# Patient Record
Sex: Male | Born: 1960 | Race: Black or African American | Hispanic: No | Marital: Married | Smoking: Current every day smoker
Health system: Southern US, Community
[De-identification: ages and names within clinical notes are randomized; demographics above are authoritative.]

## PROBLEM LIST (undated history)

## (undated) DIAGNOSIS — I10 Essential (primary) hypertension: Secondary | ICD-10-CM

## (undated) DIAGNOSIS — E119 Type 2 diabetes mellitus without complications: Secondary | ICD-10-CM

## (undated) DIAGNOSIS — E7800 Pure hypercholesterolemia, unspecified: Secondary | ICD-10-CM

## (undated) DIAGNOSIS — I639 Cerebral infarction, unspecified: Secondary | ICD-10-CM

## (undated) HISTORY — PX: KNEE SURGERY: SHX244

## (undated) HISTORY — PX: BACK SURGERY: SHX140

## (undated) HISTORY — PX: EXTERNAL EAR SURGERY: SHX627

---

## 1999-01-13 ENCOUNTER — Ambulatory Visit (HOSPITAL_BASED_OUTPATIENT_CLINIC_OR_DEPARTMENT_OTHER): Admission: RE | Admit: 1999-01-13 | Discharge: 1999-01-13 | Payer: Self-pay | Admitting: Specialist

## 1999-11-20 ENCOUNTER — Encounter: Payer: Self-pay | Admitting: Family Medicine

## 1999-11-20 ENCOUNTER — Ambulatory Visit (HOSPITAL_COMMUNITY): Admission: RE | Admit: 1999-11-20 | Discharge: 1999-11-20 | Payer: Self-pay | Admitting: Family Medicine

## 2000-12-25 ENCOUNTER — Emergency Department (HOSPITAL_COMMUNITY): Admission: EM | Admit: 2000-12-25 | Discharge: 2000-12-25 | Payer: Self-pay | Admitting: *Deleted

## 2000-12-25 ENCOUNTER — Encounter: Payer: Self-pay | Admitting: *Deleted

## 2002-09-16 ENCOUNTER — Ambulatory Visit (HOSPITAL_COMMUNITY): Admission: RE | Admit: 2002-09-16 | Discharge: 2002-09-16 | Payer: Self-pay | Admitting: Family Medicine

## 2002-09-16 ENCOUNTER — Encounter: Payer: Self-pay | Admitting: Family Medicine

## 2009-02-14 ENCOUNTER — Encounter: Admission: RE | Admit: 2009-02-14 | Discharge: 2009-02-14 | Payer: Self-pay | Admitting: Family Medicine

## 2013-01-26 ENCOUNTER — Ambulatory Visit: Payer: Self-pay

## 2013-01-26 ENCOUNTER — Other Ambulatory Visit: Payer: Self-pay | Admitting: Occupational Medicine

## 2013-01-26 DIAGNOSIS — Z021 Encounter for pre-employment examination: Secondary | ICD-10-CM

## 2016-09-15 ENCOUNTER — Encounter (HOSPITAL_BASED_OUTPATIENT_CLINIC_OR_DEPARTMENT_OTHER): Payer: Self-pay

## 2016-09-15 ENCOUNTER — Emergency Department (HOSPITAL_BASED_OUTPATIENT_CLINIC_OR_DEPARTMENT_OTHER)
Admission: EM | Admit: 2016-09-15 | Discharge: 2016-09-15 | Disposition: A | Discharge status: Home / Self Care | Payer: BLUE CROSS/BLUE SHIELD | Attending: Emergency Medicine | Admitting: Emergency Medicine

## 2016-09-15 DIAGNOSIS — M545 Low back pain: Secondary | ICD-10-CM | POA: Insufficient documentation

## 2016-09-15 DIAGNOSIS — M25552 Pain in left hip: Secondary | ICD-10-CM | POA: Insufficient documentation

## 2016-09-15 DIAGNOSIS — G8929 Other chronic pain: Secondary | ICD-10-CM | POA: Insufficient documentation

## 2016-09-15 DIAGNOSIS — F17200 Nicotine dependence, unspecified, uncomplicated: Secondary | ICD-10-CM | POA: Diagnosis not present

## 2016-09-15 DIAGNOSIS — M5442 Lumbago with sciatica, left side: Secondary | ICD-10-CM

## 2016-09-15 MED ORDER — METHOCARBAMOL 500 MG PO TABS: 500.0000 mg | ORAL_TABLET | Freq: Two times a day (BID) | ORAL | 0 refills | Status: DC

## 2016-09-15 MED ORDER — OXYCODONE-ACETAMINOPHEN 5-325 MG PO TABS
1.0000 | ORAL_TABLET | Freq: Once | ORAL | Status: AC
  Administered 2016-09-15: 1 via ORAL
  Filled 2016-09-15: qty 1

## 2016-09-15 MED ORDER — NAPROXEN 500 MG PO TABS: 500.0000 mg | ORAL_TABLET | Freq: Two times a day (BID) | ORAL | 0 refills | Status: DC

## 2016-09-15 NOTE — ED Provider Notes (Signed)
[facility]   CSN: [id number] Arrival date & time: 09/15/16  1830   By signing my name below, I, Soijett Blue, attest that this documentation has been prepared under the direction and in the presence of Felicie Mornavid Lekesha Claw, NP Electronically Signed: Soijett Blue, ED Scribe. 09/15/16. 10:29 PM.  History   Chief Complaint Chief Complaint  Patient presents with  . Back Pain    HPI Jason Meadows is a 56 y.o. male who presents to the Emergency Department complaining of intermittent lower left back pain onset 2 months ago worsening this morning. Pt states that he had an exacerbation of his lower left back pain this morning following bending over. Pt notes that his lower left back pain radiates to his left hip and left upper leg. He has tried ibuprofen with no relief for his symptoms. Pt denies bowel/bladder incontinence, cauda equina symptoms, numbness, tingling, nausea, vomiting, and any other symptoms. Denies having a PCP or insurance at this time.    The history is provided by the patient. No language interpreter was used.  Back Pain   This is a recurrent problem. Episode onset: 2 months ago. The problem occurs every several days. The problem has not changed since onset.The pain is associated with no known injury. The pain is present in the lumbar spine. The pain radiates to the left thigh. The pain is moderate. The symptoms are aggravated by bending. The pain is the same all the time. Pertinent negatives include no numbness, no bowel incontinence, no perianal numbness, no bladder incontinence and no tingling. He has tried NSAIDs for the symptoms. The treatment provided no relief.    History reviewed. No pertinent past medical history.  There are no active problems to display for this patient.   Past Surgical History:  Procedure Laterality Date  . EXTERNAL EAR SURGERY    . KNEE SURGERY         Home Medications    Prior to Admission medications   Not on  File    Family History No family history on file.  Social History Social History  Substance Use Topics  . Smoking status: Current Every Day Smoker  . Smokeless tobacco: Never Used  . Alcohol use No     Allergies   Patient has no known allergies.   Review of Systems Review of Systems  Gastrointestinal: Negative for bowel incontinence, nausea and vomiting.       No bowel incontinence.   Genitourinary: Negative for bladder incontinence.       No bladder incontinence.   Musculoskeletal: Positive for arthralgias (left hip), back pain (lower left) and myalgias (left upper leg).  Neurological: Negative for tingling and numbness.       No tingling  All other systems reviewed and are negative.    Physical Exam Updated Vital Signs BP 161/98 (BP Location: Left Arm)   Pulse 71   Temp 98.9 F (37.2 C) (Oral)   Resp 18   Ht 5\' 11"  (1.803 m)   Wt 226 lb (102.5 kg)   SpO2 100%   BMI 31.52 kg/m   Physical Exam  Constitutional: He is oriented to person, place, and time. He appears well-developed and well-nourished. No distress.  HENT:  Head: Normocephalic and atraumatic.  Eyes: EOM are normal.  Neck: Neck supple.  Cardiovascular: Normal rate, regular rhythm and normal heart sounds.  Exam reveals no gallop and no friction rub.   No murmur heard. Pulmonary/Chest: Effort normal and breath sounds normal. No  respiratory distress. He has no wheezes. He has no rales.  Abdominal: Soft. He exhibits no distension. There is no tenderness.  Musculoskeletal: Normal range of motion.  Left sided low back tenderness with pain that radiates to the hip and thigh. No red flag symptoms.   Neurological: He is alert and oriented to person, place, and time.  Skin: Skin is warm and dry.  Psychiatric: He has a normal mood and affect. His behavior is normal.  Nursing note and vitals reviewed.    ED Treatments / Results  DIAGNOSTIC STUDIES: Oxygen Saturation is 100% on RA, nl by my  interpretation.    COORDINATION OF CARE: 10:12 PM Discussed treatment plan with pt at bedside which includes percocet and pt agreed to plan.   Procedures Procedures (including critical care time)  Medications Ordered in ED Medications - No data to display   Initial Impression / Assessment and Plan / ED Course  I have reviewed the triage vital signs and the nursing notes.  Patient with back pain.  No neurological deficits and normal neuro exam.  Patient is ambulatory.  No loss of bowel or bladder control.  No concern for cauda equina.  No fever, night sweats, weight loss, h/o cancer, IVDA, no recent procedure to back. No urinary symptoms suggestive of UTI.  Supportive care and return precaution discussed. Appears safe for discharge at this time. Follow up as indicated in discharge paperwork.   Final Clinical Impressions(s) / ED Diagnoses   Final diagnoses:  Chronic left-sided low back pain with left-sided sciatica    New Prescriptions New Prescriptions   METHOCARBAMOL (ROBAXIN) 500 MG TABLET    Take 1 tablet (500 mg total) by mouth 2 (two) times daily.   NAPROXEN (NAPROSYN) 500 MG TABLET    Take 1 tablet (500 mg total) by mouth 2 (two) times daily.   I personally performed the services described in this documentation, which was scribed in my presence. The recorded information has been reviewed and is accurate.     Felicie Morn, NP 09/16/16 0109    Charlynne Pander, MD 09/18/16 [phone]

## 2016-09-15 NOTE — ED Triage Notes (Signed)
C/o left lower back pain that radiates down hip and leg-denies injury-started this am after bending over-NAD-steady gait

## 2016-09-17 ENCOUNTER — Encounter (HOSPITAL_BASED_OUTPATIENT_CLINIC_OR_DEPARTMENT_OTHER): Payer: Self-pay | Admitting: *Deleted

## 2016-09-17 ENCOUNTER — Emergency Department (HOSPITAL_BASED_OUTPATIENT_CLINIC_OR_DEPARTMENT_OTHER)
Admission: EM | Admit: 2016-09-17 | Discharge: 2016-09-17 | Disposition: A | Discharge status: Home / Self Care | Payer: BLUE CROSS/BLUE SHIELD | Attending: Emergency Medicine | Admitting: Emergency Medicine

## 2016-09-17 ENCOUNTER — Emergency Department (HOSPITAL_BASED_OUTPATIENT_CLINIC_OR_DEPARTMENT_OTHER): Payer: BLUE CROSS/BLUE SHIELD

## 2016-09-17 DIAGNOSIS — F17200 Nicotine dependence, unspecified, uncomplicated: Secondary | ICD-10-CM | POA: Insufficient documentation

## 2016-09-17 DIAGNOSIS — M5442 Lumbago with sciatica, left side: Secondary | ICD-10-CM | POA: Diagnosis not present

## 2016-09-17 DIAGNOSIS — Z79899 Other long term (current) drug therapy: Secondary | ICD-10-CM | POA: Diagnosis not present

## 2016-09-17 DIAGNOSIS — M5432 Sciatica, left side: Secondary | ICD-10-CM

## 2016-09-17 DIAGNOSIS — M545 Low back pain: Secondary | ICD-10-CM | POA: Diagnosis present

## 2016-09-17 MED ORDER — KETOROLAC TROMETHAMINE 60 MG/2ML IM SOLN
60.0000 mg | Freq: Once | INTRAMUSCULAR | Status: AC
  Administered 2016-09-17: 60 mg via INTRAMUSCULAR
  Filled 2016-09-17: qty 2

## 2016-09-17 MED ORDER — LIDOCAINE 5 % EX PTCH: 1.0000 | MEDICATED_PATCH | CUTANEOUS | 0 refills | Status: DC

## 2016-09-17 NOTE — ED Triage Notes (Signed)
Numbness from shin down to great toe left side w hip pain  Denies inj

## 2016-09-17 NOTE — ED Provider Notes (Signed)
[facility]   CSN: [id number] Arrival date & time: 09/17/16  0138     History   Chief Complaint Chief Complaint  Patient presents with  . Hip Pain    HPI Jason Meadows is a 56 y.o. male.  The history is provided by the patient.  Back Pain   This is a new problem. The current episode started more than 2 days ago. The problem occurs constantly. The problem has not changed since onset.The pain is associated with no known injury. The pain is present in the sacro-iliac joint. The quality of the pain is described as stabbing and shooting. The pain radiates to the right thigh. The pain is moderate. The symptoms are aggravated by bending and twisting. The pain is the same all the time. Pertinent negatives include no chest pain, no fever, no numbness, no weight loss, no headaches, no abdominal pain, no abdominal swelling, no bowel incontinence, no perianal numbness, no bladder incontinence, no dysuria, no pelvic pain, no leg pain, no paresis, no tingling and no weakness. Treatments tried: naproxen and robaxin. The treatment provided no relief. Risk factors include obesity.  No loss of control of the bowels or bladder no numbness. No gait abnormality  History reviewed. No pertinent past medical history.  There are no active problems to display for this patient.   Past Surgical History:  Procedure Laterality Date  . EXTERNAL EAR SURGERY    . KNEE SURGERY         Home Medications    Prior to Admission medications   Medication Sig Start Date End Date Taking? Authorizing Provider  methocarbamol (ROBAXIN) 500 MG tablet Take 1 tablet (500 mg total) by mouth 2 (two) times daily. 09/15/16   Felicie Morn, NP  naproxen (NAPROSYN) 500 MG tablet Take 1 tablet (500 mg total) by mouth 2 (two) times daily. 09/15/16   Felicie Morn, NP    Family History No family history on file.  Social History Social History  Substance Use Topics  . Smoking status: Current Every Day  Smoker  . Smokeless tobacco: Never Used  . Alcohol use No     Allergies   Patient has no known allergies.   Review of Systems Review of Systems  Constitutional: Negative for fever and weight loss.  Respiratory: Negative for shortness of breath.   Cardiovascular: Negative for chest pain.  Gastrointestinal: Negative for abdominal pain and bowel incontinence.  Genitourinary: Negative for bladder incontinence, difficulty urinating, dysuria, flank pain, frequency, genital sores and pelvic pain.  Musculoskeletal: Positive for back pain. Negative for gait problem, neck pain and neck stiffness.  Neurological: Negative for dizziness, tingling, tremors, seizures, speech difficulty, weakness, numbness and headaches.  All other systems reviewed and are negative.    Physical Exam Updated Vital Signs BP (!) 166/103 (BP Location: Right Arm)   Pulse 88   Temp 98.3 F (36.8 C) (Oral)   Resp 20   Ht 5\' 11"  (1.803 m)   Wt 226 lb (102.5 kg)   SpO2 97%   BMI 31.52 kg/m   Physical Exam  Constitutional: He is oriented to person, place, and time. He appears well-developed and well-nourished. No distress.  HENT:  Head: Normocephalic and atraumatic.  Mouth/Throat: No oropharyngeal exudate.  Eyes: Conjunctivae and EOM are normal. Pupils are equal, round, and reactive to light.  Neck: Normal range of motion. Neck supple.  Cardiovascular: Normal rate, regular rhythm and intact distal pulses.   Pulmonary/Chest: Effort normal and breath sounds normal.  He has no wheezes. He has no rales.  Abdominal: Soft. Bowel sounds are normal. He exhibits no mass. There is no tenderness. There is no rebound and no guarding.  Musculoskeletal: Normal range of motion. He exhibits no tenderness.  Neurological: He is alert and oriented to person, place, and time. He displays normal reflexes. He exhibits normal muscle tone. Coordination normal.  Intact L5/s1 intact perineal sensation.  Sensation intact to all nerve  distributions of the LLE 5/5 strength  Skin: Skin is warm and dry. Capillary refill takes less than 2 seconds.  Psychiatric: He has a normal mood and affect.     ED Treatments / Results   Vitals:   09/17/16 0215  BP: (!) 166/103  Pulse: 88  Resp: 20  Temp: 98.3 F (36.8 C)    Procedures Procedures (including critical care time)  Medications Ordered in ED Medications  ketorolac (TORADOL) injection 60 mg (not administered)     Final Clinical Impressions(s) / ED Diagnoses  [facility]: [facility]. All questions answered to patient's satisfaction. Based on history and exam patient has been appropriately medically screened and emergency conditions excluded. Patient is stable for discharge at this time. Strict return precautions given for any further episodes, persistent fever, weakness or any concerns. New Prescriptions New Prescriptions   No medications on file     Merideth Bosque, MD 09/17/16 [phone]

## 2019-12-15 ENCOUNTER — Encounter (HOSPITAL_BASED_OUTPATIENT_CLINIC_OR_DEPARTMENT_OTHER): Payer: Self-pay

## 2019-12-15 ENCOUNTER — Other Ambulatory Visit: Payer: Self-pay

## 2019-12-15 ENCOUNTER — Emergency Department (HOSPITAL_BASED_OUTPATIENT_CLINIC_OR_DEPARTMENT_OTHER)
Admission: EM | Admit: 2019-12-15 | Discharge: 2019-12-15 | Disposition: A | Discharge status: Home / Self Care | Payer: BC Managed Care – PPO | Attending: Emergency Medicine | Admitting: Emergency Medicine

## 2019-12-15 ENCOUNTER — Emergency Department (HOSPITAL_BASED_OUTPATIENT_CLINIC_OR_DEPARTMENT_OTHER): Payer: BC Managed Care – PPO

## 2019-12-15 DIAGNOSIS — K5903 Drug induced constipation: Secondary | ICD-10-CM | POA: Insufficient documentation

## 2019-12-15 DIAGNOSIS — G8918 Other acute postprocedural pain: Secondary | ICD-10-CM | POA: Insufficient documentation

## 2019-12-15 DIAGNOSIS — Z79899 Other long term (current) drug therapy: Secondary | ICD-10-CM | POA: Insufficient documentation

## 2019-12-15 DIAGNOSIS — F17210 Nicotine dependence, cigarettes, uncomplicated: Secondary | ICD-10-CM | POA: Insufficient documentation

## 2019-12-15 DIAGNOSIS — I10 Essential (primary) hypertension: Secondary | ICD-10-CM | POA: Diagnosis not present

## 2019-12-15 DIAGNOSIS — M545 Low back pain, unspecified: Secondary | ICD-10-CM

## 2019-12-15 DIAGNOSIS — R109 Unspecified abdominal pain: Secondary | ICD-10-CM | POA: Diagnosis present

## 2019-12-15 DIAGNOSIS — R1084 Generalized abdominal pain: Secondary | ICD-10-CM | POA: Diagnosis not present

## 2019-12-15 DIAGNOSIS — E119 Type 2 diabetes mellitus without complications: Secondary | ICD-10-CM | POA: Diagnosis not present

## 2019-12-15 HISTORY — DX: Type 2 diabetes mellitus without complications: E11.9

## 2019-12-15 HISTORY — DX: Pure hypercholesterolemia, unspecified: E78.00

## 2019-12-15 HISTORY — DX: Essential (primary) hypertension: I10

## 2019-12-15 LAB — CBC WITH DIFFERENTIAL/PLATELET
Abs Immature Granulocytes: 0.03 10*3/uL (ref 0.00–0.07)
Basophils Absolute: 0 10*3/uL (ref 0.0–0.1)
Basophils Relative: 1 %
Eosinophils Absolute: 0.1 10*3/uL (ref 0.0–0.5)
Eosinophils Relative: 2 %
HCT: 41.5 % (ref 39.0–52.0)
Hemoglobin: 14 g/dL (ref 13.0–17.0)
Immature Granulocytes: 0 %
Lymphocytes Relative: 25 %
Lymphs Abs: 2 10*3/uL (ref 0.7–4.0)
MCH: 29.9 pg (ref 26.0–34.0)
MCHC: 33.7 g/dL (ref 30.0–36.0)
MCV: 88.5 fL (ref 80.0–100.0)
Monocytes Absolute: 0.8 10*3/uL (ref 0.1–1.0)
Monocytes Relative: 10 %
Neutro Abs: 5 10*3/uL (ref 1.7–7.7)
Neutrophils Relative %: 62 %
Platelets: 191 10*3/uL (ref 150–400)
RBC: 4.69 MIL/uL (ref 4.22–5.81)
RDW: 13.8 % (ref 11.5–15.5)
WBC: 8 10*3/uL (ref 4.0–10.5)
nRBC: 0 % (ref 0.0–0.2)

## 2019-12-15 LAB — COMPREHENSIVE METABOLIC PANEL
ALT: 24 U/L (ref 0–44)
AST: 30 U/L (ref 15–41)
Albumin: 3.8 g/dL (ref 3.5–5.0)
Alkaline Phosphatase: 70 U/L (ref 38–126)
Anion gap: 10 (ref 5–15)
BUN: 13 mg/dL (ref 6–20)
CO2: 28 mmol/L (ref 22–32)
Calcium: 8.7 mg/dL — ABNORMAL LOW (ref 8.9–10.3)
Chloride: 100 mmol/L (ref 98–111)
Creatinine, Ser: 1.13 mg/dL (ref 0.61–1.24)
GFR calc Af Amer: >60 mL/min (ref >60)
GFR calc non Af Amer: >60 mL/min (ref >60)
Glucose, Bld: 92 mg/dL (ref 70–99)
Potassium: 3.5 mmol/L (ref 3.5–5.1)
Sodium: 138 mmol/L (ref 135–145)
Total Bilirubin: 0.4 mg/dL (ref 0.3–1.2)
Total Protein: 7.1 g/dL (ref 6.5–8.1)

## 2019-12-15 LAB — URINALYSIS, ROUTINE W REFLEX MICROSCOPIC
Bilirubin Urine: NEGATIVE
Glucose, UA: NEGATIVE mg/dL
Hgb urine dipstick: NEGATIVE
Ketones, ur: NEGATIVE mg/dL
Leukocytes,Ua: NEGATIVE
Nitrite: NEGATIVE
Protein, ur: NEGATIVE mg/dL
Specific Gravity, Urine: 1.01 (ref 1.005–1.030)
pH: 6.5 (ref 5.0–8.0)

## 2019-12-15 LAB — LIPASE, BLOOD: Lipase: 23 U/L (ref 11–51)

## 2019-12-15 MED ORDER — METHOCARBAMOL 500 MG PO TABS
750.00 | ORAL_TABLET | ORAL | Status: DC
Start: 2019-12-14 — End: 2019-12-15

## 2019-12-15 MED ORDER — INSULIN LISPRO 100 UNIT/ML ~~LOC~~ SOLN
2.00 | SUBCUTANEOUS | Status: DC
Start: 2019-12-14 — End: 2019-12-15

## 2019-12-15 MED ORDER — GLUCOSE 40 % PO GEL
15.00 | ORAL | Status: DC
Start: ? — End: 2019-12-15

## 2019-12-15 MED ORDER — POLYETHYLENE GLYCOL 3350 17 GM/SCOOP PO POWD
17.00 | ORAL | Status: DC
Start: 2019-12-15 — End: 2019-12-15

## 2019-12-15 MED ORDER — DIAZEPAM 5 MG PO TABS
5.00 | ORAL_TABLET | ORAL | Status: DC
Start: ? — End: 2019-12-15

## 2019-12-15 MED ORDER — HYDROMORPHONE HCL 1 MG/ML IJ SOLN
1.0000 mg | Freq: Once | INTRAMUSCULAR | Status: AC
  Administered 2019-12-15: 1 mg via INTRAVENOUS
  Filled 2019-12-15: qty 1

## 2019-12-15 MED ORDER — ONDANSETRON HCL 4 MG/2ML IJ SOLN
4.0000 mg | Freq: Once | INTRAMUSCULAR | Status: AC
  Administered 2019-12-15: 18:00:00 4 mg via INTRAVENOUS
  Filled 2019-12-15: qty 2

## 2019-12-15 MED ORDER — HYDROMORPHONE HCL 2 MG PO TABS
2.00 | ORAL_TABLET | ORAL | Status: DC
Start: ? — End: 2019-12-15

## 2019-12-15 MED ORDER — HYDROCODONE-ACETAMINOPHEN 5-325 MG PO TABS
2.00 | ORAL_TABLET | ORAL | Status: DC
Start: ? — End: 2019-12-15

## 2019-12-15 MED ORDER — MORPHINE SULFATE (PF) 4 MG/ML IV SOLN
4.0000 mg | Freq: Once | INTRAVENOUS | Status: AC
  Administered 2019-12-15: 4 mg via INTRAVENOUS
  Filled 2019-12-15: qty 1

## 2019-12-15 MED ORDER — SODIUM CHLORIDE FLUSH 0.9 % IV SOLN
10.00 | INTRAVENOUS | Status: DC
Start: 2019-12-14 — End: 2019-12-15

## 2019-12-15 MED ORDER — HYDROCODONE-ACETAMINOPHEN 5-325 MG PO TABS
1.00 | ORAL_TABLET | ORAL | Status: DC
Start: ? — End: 2019-12-15

## 2019-12-15 MED ORDER — ONDANSETRON HCL 4 MG/2ML IJ SOLN
4.00 | INTRAMUSCULAR | Status: DC
Start: ? — End: 2019-12-15

## 2019-12-15 MED ORDER — BISACODYL 10 MG RE SUPP
10.00 | RECTAL | Status: DC
Start: ? — End: 2019-12-15

## 2019-12-15 MED ORDER — METFORMIN HCL ER 500 MG PO TB24
500.00 | ORAL_TABLET | ORAL | Status: DC
Start: 2019-12-14 — End: 2019-12-15

## 2019-12-15 MED ORDER — IOHEXOL 300 MG/ML  SOLN
100.0000 mL | Freq: Once | INTRAMUSCULAR | Status: AC | PRN
  Administered 2019-12-15: 19:00:00 100 mL via INTRAVENOUS

## 2019-12-15 MED ORDER — DEXTROSE 10 % IV SOLN
125.00 | INTRAVENOUS | Status: DC
Start: ? — End: 2019-12-15

## 2019-12-15 MED ORDER — SENNOSIDES-DOCUSATE SODIUM 8.6-50 MG PO TABS
2.00 | ORAL_TABLET | ORAL | Status: DC
Start: 2019-12-14 — End: 2019-12-15

## 2019-12-15 MED ORDER — DOCUSATE SODIUM 283 MG RE ENEM
283.00 | ENEMA | RECTAL | Status: DC
Start: ? — End: 2019-12-15

## 2019-12-15 MED ORDER — ATORVASTATIN CALCIUM 10 MG PO TABS
20.00 | ORAL_TABLET | ORAL | Status: DC
Start: 2019-12-14 — End: 2019-12-15

## 2019-12-15 MED ORDER — SODIUM CHLORIDE FLUSH 0.9 % IV SOLN
10.00 | INTRAVENOUS | Status: DC
Start: ? — End: 2019-12-15

## 2019-12-15 MED ORDER — GENERIC EXTERNAL MEDICATION
Status: DC
Start: ? — End: 2019-12-15

## 2019-12-15 MED ORDER — SODIUM CHLORIDE 0.9 % IV BOLUS
1000.0000 mL | Freq: Once | INTRAVENOUS | Status: AC
  Administered 2019-12-15: 1000 mL via INTRAVENOUS

## 2019-12-15 NOTE — ED Provider Notes (Signed)
[facility] EMERGENCY DEPARTMENT Provider Note   CSN: [id number] Arrival date & time: 12/15/19  1716    History Chief Complaint  Patient presents with  . Abdominal Pain    Jason Meadows is a 59 y.o. male with medical history significant for diabetes, hypertension, hypercholesterolemia who presents for evaluation of abdominal pain.  Patient is 2 days postop lumbar fusion with [facility].  States he has not had a bowel movement since Tuesday.  Normally goes once to twice daily.  States he is not passing any gas.  He has not taken a stool softener or laxative.  Patient states he has had increased pain to his back.  He denies any bowel or bladder incontinence, saddle paresthesias, numbness.  Has been ambulatory with walker.  Denies any fever or chills.  States his wife did notice today that he had some dried blood on his bandage.  Denies fever, chills, nausea, vomiting, chest pain, shortness of breath, difficulty urinating, numbness or tingling, unilateral redness, swelling or warmth.  Denies additional aggravating or alleviating factors.  History obtained from patient and past medical records.  No interpreter is used.  HPI     Past Medical History:  Diagnosis Date  . Diabetes mellitus without complication (HCC)   . High cholesterol   . Hypertension     There are no problems to display for this patient.   Past Surgical History:  Procedure Laterality Date  . BACK SURGERY    . EXTERNAL EAR SURGERY    . KNEE SURGERY         No family history on file.  Social History   Tobacco Use  . Smoking status: Current Every Day Smoker    Types: Cigarettes  . Smokeless tobacco: Never Used  Substance Use Topics  . Alcohol use: No  . Drug use: No    Home Medications Prior to Admission medications   Medication Sig Start Date End Date Taking? Authorizing Provider  atorvastatin (LIPITOR) 10 MG tablet atorvastatin 10 mg tablet 09/18/16  Yes [provider]   lidocaine (LIDODERM) 5 % Place 1 patch onto the skin daily. Remove & Discard patch within 12 hours or as directed by MD 09/17/16   Nicanor Alcon, April, MD  lisinopril-hydrochlorothiazide (ZESTORETIC) 10-12.5 MG tablet Take 1 tablet by mouth daily. 12/04/19   [provider]  methocarbamol (ROBAXIN) 500 MG tablet Take 1 tablet (500 mg total) by mouth 2 (two) times daily. 09/15/16   Felicie Morn, NP  methocarbamol (ROBAXIN) 750 MG tablet Take 750 mg by mouth 4 (four) times daily. 12/14/19   [provider]  naproxen (NAPROSYN) 500 MG tablet Take 1 tablet (500 mg total) by mouth 2 (two) times daily. 09/15/16   Felicie Morn, NP  oxyCODONE-acetaminophen Pincus Large) 5-325 MG/5ML solution SMARTSIG:1 Tablet(s) By Mouth Every 4 Hours PRN 06/26/19   [provider]    Allergies    Patient has no known allergies.  Review of Systems   Review of Systems  Constitutional: Negative.   HENT: Negative.   Respiratory: Negative.   Cardiovascular: Negative.   Gastrointestinal: Positive for abdominal pain and constipation. Negative for abdominal distention, anal bleeding, blood in stool, diarrhea, nausea, rectal pain and vomiting.  Genitourinary: Negative.   Musculoskeletal: Positive for back pain. Negative for arthralgias, neck pain and neck stiffness.  Skin: Positive for wound (Recent surgical incision).  Neurological: Negative.   All other systems reviewed and are negative.   Physical Exam Updated Vital Signs BP 134/87  Pulse 74   Temp 98.4 F (36.9 C) (Oral)   Resp 20   Ht  (1.753 m)   Wt 106.6 kg   SpO2 96%   BMI 34.70 kg/m   Physical Exam Vitals and nursing note reviewed.  Constitutional:      General: He is not in acute distress.    Appearance: He is well-developed. He is not ill-appearing, toxic-appearing or diaphoretic.  HENT:     Head: Normocephalic and atraumatic.     Mouth/Throat:     Mouth: Mucous membranes are moist.     Pharynx: Oropharynx is clear.  Eyes:      Pupils: Pupils are equal, round, and reactive to light.  Cardiovascular:     Rate and Rhythm: Normal rate and regular rhythm.     Pulses:          Dorsalis pedis pulses are 2+ on the right side and 2+ on the left side.       Posterior tibial pulses are 2+ on the right side and 2+ on the left side.     Heart sounds: Normal heart sounds.  Pulmonary:     Effort: Pulmonary effort is normal. No respiratory distress.     Breath sounds: Normal breath sounds.  Abdominal:     General: Bowel sounds are normal. There is distension.     Palpations: Abdomen is soft.     Tenderness: There is generalized abdominal tenderness. There is no right CVA tenderness, left CVA tenderness, guarding or rebound. Negative signs include Murphy's sign and McBurney's sign.     Hernia: No hernia is present.     Comments: Distended, generalized tenderness palpation.  No overlying skin changes.  No rebound or guarding  Genitourinary:    Comments: Declines rectal exam to assess for impaction Musculoskeletal:        General: Normal range of motion.     Cervical back: Normal range of motion and neck supple.     Comments: Staples to lower lumbar region.  Mild dried blood to lower staples however no purulent drainage.  No surrounding erythema or warmth.  Is able to flex and extend at bilateral legs.  No shortening or rotation of legs.  Wiggles toes without difficulty.  Feet:     Right foot:     Skin integrity: Skin integrity normal.     Left foot:     Skin integrity: Skin integrity normal.  Skin:    General: Skin is warm and dry.     Capillary Refill: Capillary refill takes less than 2 seconds.     Comments: Recent surgical wound to lumbar spine.  No active bleeding or drainage.  No surrounding edema, erythema or warmth.  Neurological:     General: No focal deficit present.     Mental Status: He is alert.     Cranial Nerves: Cranial nerves are intact.     Sensory: Sensation is intact.     Motor: Motor function is  intact.     Coordination: Coordination is intact.     Gait: Gait is intact.     Comments: 5/5 strength to bilateral lower extremities at difficulty.  Gait without ataxia.    ED Results / Procedures / Treatments   Labs (all labs ordered are listed, but only abnormal results are displayed) Labs Reviewed  COMPREHENSIVE METABOLIC PANEL - Abnormal; Notable for the following components:      Result Value   Calcium 8.7 (*)    All other components within  normal limits  CBC WITH DIFFERENTIAL/PLATELET  LIPASE, BLOOD  URINALYSIS, ROUTINE W REFLEX MICROSCOPIC    EKG None  Radiology CT Abdomen Pelvis W Contrast  Result Date: 12/15/2019 CLINICAL DATA:  Abdominal pain and bloating. Pain with taking a deep breath. Lumbar surgery 3 days ago. EXAM: CT ABDOMEN AND PELVIS WITH CONTRAST CT LUMBAR SPINE WITHOUT CONTRAST TECHNIQUE: Multidetector CT imaging of the abdomen and pelvis was performed using the standard protocol following bolus administration of intravenous contrast. CONTRAST:  OMNIPAQUE IOHEXOL 300 MG/ML  SOLN COMPARISON:  MR lumbar spine, 11/23/2019 FINDINGS: ABDOMEN AND PELVIS CT Lower chest: Clear lung bases.  Heart normal in size. Hepatobiliary: No focal liver abnormality is seen. No gallstones, gallbladder wall thickening, or biliary dilatation. Pancreas: Unremarkable. No pancreatic ductal dilatation or surrounding inflammatory changes. Spleen: Normal in size without focal abnormality. Adrenals/Urinary Tract: Adrenal glands are unremarkable. Kidneys are normal, without renal calculi, focal lesion, or hydronephrosis. Bladder is unremarkable. Stomach/Bowel: Stomach is within normal limits. Appendix appears normal. No evidence of bowel wall thickening, distention, or inflammatory changes. Vascular/Lymphatic: No significant vascular findings are present. No enlarged abdominal or pelvic lymph nodes. Reproductive: Normal sized prostate gland. Other: No abdominal wall hernia or abnormality. No  abdominopelvic ascites. Musculoskeletal: Status post L4-L5 posterior fusion, described under the lumbar spine CT section. No fracture. No bone lesion. LUMBAR SPINE CT Segmentation: 5 lumbar type vertebra. Alignment: Slight, approximately 2-3 mm, anterolisthesis of L3 on L4. No other malalignment. Osseous structures: No fracture or bone lesion. Bilateral pedicle screws at L4-L5 with intact interconnecting rods. The pedicle screws are well positioned and well-seated. There is a radiolucent disc spacer well centered at the L4-L5 disc interspace. Adjacent soft tissues: Artifact from the fusion hardware somewhat limits assessment the soft tissues at the L4 and L5 levels. No convincing fluid collection to suggest an abscess. There is posterior subcutaneous edema. Disc levels: T12-L1: Minor disc bulging.  No stenosis.  No disc herniation. L1-L2: Normal. L2-L3: Minor disc bulging. No disc herniation. No significant stenosis. L3-L4: Moderate facet degenerative change, minor disc bulging and the grade 1 anterolisthesis combining to cause mild central stenosis narrowing the AP diameter of the canal to 1 cm, and mild superolateral recess narrowing and mild bilateral neural foraminal narrowing. L4-L5: Limited assessment of the fused level due to metallic hardware artifact. No convincing spinal canal collection or hematoma. L5-S1: Unremarkable. IMPRESSION: CT ABDOMEN AND PELVIS 1. No acute findings within the abdomen or pelvis. 2. Status post L4-L5 posterior lumbar spine fusion. No other abnormalities. CT LUMBAR SPINE 1. Well-positioned L4-L5 posterior lumbar spine fusion hardware. No evidence of an operative complication. 2. Disc and facet degenerative changes at L3-L4 with mild tricompartmental stenosis. Electronically Signed   By: Amie Portland M.D.   On: 12/15/2019 19:30   CT L-SPINE NO CHARGE  Result Date: 12/15/2019 CLINICAL DATA:  Abdominal pain and bloating. Pain with taking a deep breath. Lumbar surgery 3 days ago.  EXAM: CT ABDOMEN AND PELVIS WITH CONTRAST CT LUMBAR SPINE WITHOUT CONTRAST TECHNIQUE: Multidetector CT imaging of the abdomen and pelvis was performed using the standard protocol following bolus administration of intravenous contrast. CONTRAST:  OMNIPAQUE IOHEXOL 300 MG/ML  SOLN COMPARISON:  MR lumbar spine, 11/23/2019 FINDINGS: ABDOMEN AND PELVIS CT Lower chest: Clear lung bases.  Heart normal in size. Hepatobiliary: No focal liver abnormality is seen. No gallstones, gallbladder wall thickening, or biliary dilatation. Pancreas: Unremarkable. No pancreatic ductal dilatation or surrounding inflammatory changes. Spleen: Normal in size without focal abnormality. Adrenals/Urinary  Tract: Adrenal glands are unremarkable. Kidneys are normal, without renal calculi, focal lesion, or hydronephrosis. Bladder is unremarkable. Stomach/Bowel: Stomach is within normal limits. Appendix appears normal. No evidence of bowel wall thickening, distention, or inflammatory changes. Vascular/Lymphatic: No significant vascular findings are present. No enlarged abdominal or pelvic lymph nodes. Reproductive: Normal sized prostate gland. Other: No abdominal wall hernia or abnormality. No abdominopelvic ascites. Musculoskeletal: Status post L4-L5 posterior fusion, described under the lumbar spine CT section. No fracture. No bone lesion. LUMBAR SPINE CT Segmentation: 5 lumbar type vertebra. Alignment: Slight, approximately 2-3 mm, anterolisthesis of L3 on L4. No other malalignment. Osseous structures: No fracture or bone lesion. Bilateral pedicle screws at L4-L5 with intact interconnecting rods. The pedicle screws are well positioned and well-seated. There is a radiolucent disc spacer well centered at the L4-L5 disc interspace. Adjacent soft tissues: Artifact from the fusion hardware somewhat limits assessment the soft tissues at the L4 and L5 levels. No convincing fluid collection to suggest an abscess. There is posterior subcutaneous  edema. Disc levels: T12-L1: Minor disc bulging.  No stenosis.  No disc herniation. L1-L2: Normal. L2-L3: Minor disc bulging. No disc herniation. No significant stenosis. L3-L4: Moderate facet degenerative change, minor disc bulging and the grade 1 anterolisthesis combining to cause mild central stenosis narrowing the AP diameter of the canal to 1 cm, and mild superolateral recess narrowing and mild bilateral neural foraminal narrowing. L4-L5: Limited assessment of the fused level due to metallic hardware artifact. No convincing spinal canal collection or hematoma. L5-S1: Unremarkable. IMPRESSION: CT ABDOMEN AND PELVIS 1. No acute findings within the abdomen or pelvis. 2. Status post L4-L5 posterior lumbar spine fusion. No other abnormalities. CT LUMBAR SPINE 1. Well-positioned L4-L5 posterior lumbar spine fusion hardware. No evidence of an operative complication. 2. Disc and facet degenerative changes at L3-L4 with mild tricompartmental stenosis. Electronically Signed   By: Amie Portland M.D.   On: 12/15/2019 19:30    Procedures Procedures (including critical care time)  Medications Ordered in ED Medications  sodium chloride 0.9 % bolus 1,000 mL ( Intravenous Stopped 12/15/19 1945)  ondansetron (ZOFRAN) injection 4 mg (4 mg Intravenous Given 12/15/19 1806)  morphine 4 MG/ML injection 4 mg (4 mg Intravenous Given 12/15/19 1806)  iohexol (OMNIPAQUE) 300 MG/ML solution 100 mL (100 mLs Intravenous Contrast Given 12/15/19 1858)  HYDROmorphone (DILAUDID) injection 1 mg (1 mg Intravenous Given 12/15/19 1946)   ED Course  I have reviewed the triage vital signs and the nursing notes.  Pertinent labs & imaging results that were available during my care of the patient were reviewed by me and considered in my medical decision making (see chart for details).  59 year old presents for evaluation of abdominal pain.  Patient recently had surgery 2 days ago.  Has not had a bowel movement x3-4 days.  Normally goes multiple  times a day.  No emesis.  Describes his abdominal pain as cramping.  Did have some mild oozing to his recent surgical site however does not appear infected.  This had new dressing placed during his ED stay and subsequently did not have any further bleeding.  No evidence of surrounding infectious process.  No IV drug use, bowel or bladder incontinence, saddle paresthesias.  Plan on labs, imaging and reassess  Labs and imaging personally reviewed interpreted: CBC without leukocytosis Metabolic panel without electrolyte, renal abnormality Urinalysis negative Lipase 23 CT abdomen pelvis without acute pathology CT lumbar with recent postsurgical changes   Patient reassessed.  Feels significant improvement  with IV Dilaudid.  Ambulatory without difficulty.  Symptoms do seem consistent with some constipation.  I did offer GU exam to assess for impaction however patient declines.  He feels better, tolerating p.o. intake would like to try symptomatic management at home.  Discussed MiraLAX regimen.  Patient does not meet the SIRS or Sepsis criteria.  On repeat exam patient does not have a surgical abdomin and there are no peritoneal signs.  No indication of appendicitis, bowel obstruction, bowel perforation, cholecystitis, diverticulitis.  Low suspicion for acute neurosurgical emergency, specifically low suspicion for cauda equina, discitis, osteomyelitis, transverse myelitis.   The patient has been appropriately medically screened and/or stabilized in the ED. I have low suspicion for any other emergent medical condition which would require further screening, evaluation or treatment in the ED or require inpatient management.  Patient is hemodynamically stable and in no acute distress.  Patient able to ambulate in department prior to ED.  Evaluation does not show acute pathology that would require ongoing or additional emergent interventions while in the emergency department or further inpatient treatment.  I  have discussed the diagnosis with the patient and answered all questions.  Pain is been managed while in the emergency department and patient has no further complaints prior to discharge.  Patient is comfortable with plan discussed in room and is stable for discharge at this time.  I have discussed strict return precautions for returning to the emergency department.  Patient was encouraged to follow-up with PCP/specialist refer to at discharge.    MDM Rules/Calculators/A&P                       Final Clinical Impression(s) / ED Diagnoses Final diagnoses:  Low back pain  Generalized abdominal pain  Post-op pain  Drug-induced constipation    Rx / DC Orders ED Discharge Orders    None       Shealynn Saulnier A, PA-C 12/15/19 [place], Dan, DO 12/15/19 2253

## 2019-12-15 NOTE — ED Triage Notes (Signed)
Pt c/o abd pain-started yesterday-pt had back surgery 2 days ago-to triage in w/c-NAD

## 2019-12-15 NOTE — Discharge Instructions (Signed)
Take 4-5 scoops of Miralax to 64 oz of liquid. Make sure to drink plenty of liquids.  Take you pain medication as prescribed.  Return for new or worsening symptoms

## 2019-12-15 NOTE — ED Notes (Signed)
Pt reports 2 days post op for lumbar procedure.  Original post surgical dressing in place to back, area of bloody drainage toward bottom of dressing.  Pt reports taking pain medications, was not instructed to take any stool softener/laxative.  LBM Tuesday.

## 2019-12-15 NOTE — ED Notes (Signed)
Pt reports very little improvement in pain level following medication

## 2020-05-26 IMAGING — CT CT ABD-PELV W/ CM
2 of 6 series · 15 of 46 positions shown, 17 images · IV contrast (Omnipaque)
Comparison: MR lumbar spine, 11/23/2019

CLINICAL DATA: Abdominal pain and bloating. Pain with taking a deep
breath. Lumbar surgery 3 days ago.

EXAM:
CT ABDOMEN AND PELVIS WITH CONTRAST
CT LUMBAR SPINE WITHOUT CONTRAST
TECHNIQUE: Multidetector CT imaging of the abdomen and pelvis was performed
using the standard protocol following bolus administration of
intravenous contrast.
CONTRAST:  100mL OMNIPAQUE IOHEXOL 300 MG/ML  SOLN

[Series 2: axial st · axial · 0.87mm/px · z∈[-526,-42]mm · 12 of 109 slices shown, 14 images]
[im 6/109  soft-tissue]
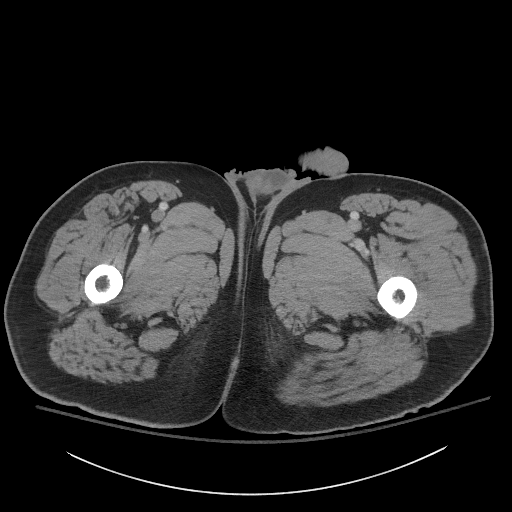
[im 6/109  bone]
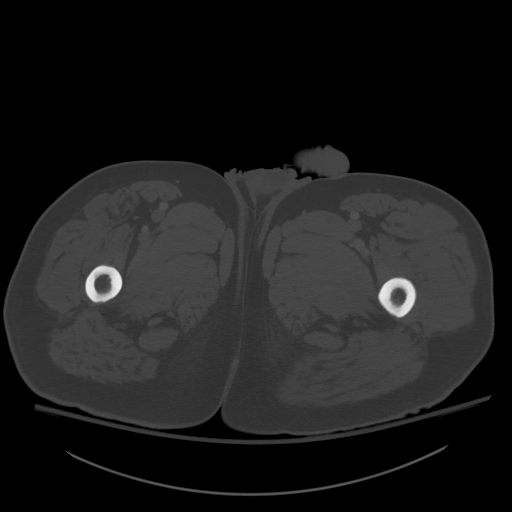
[im 17/109  soft-tissue]
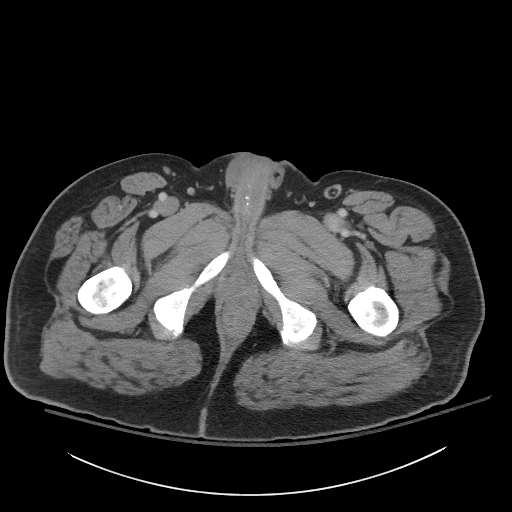
[im 22/109  soft-tissue]
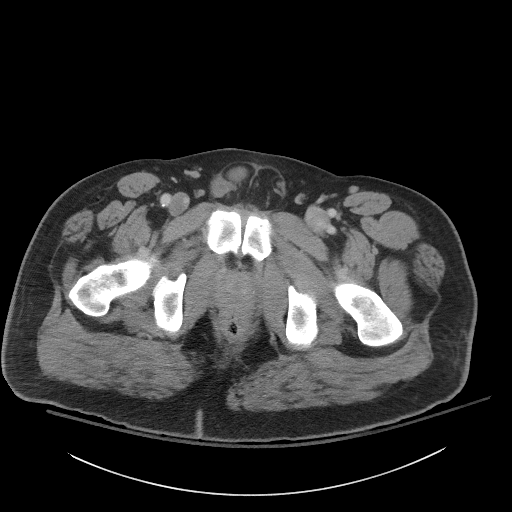
[im 33/109  soft-tissue]
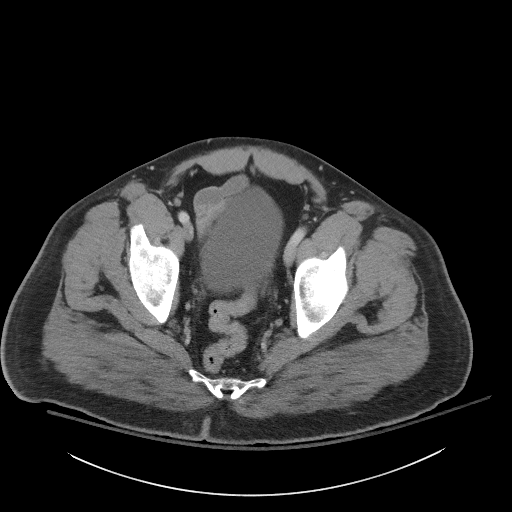
[im 44/109  soft-tissue]
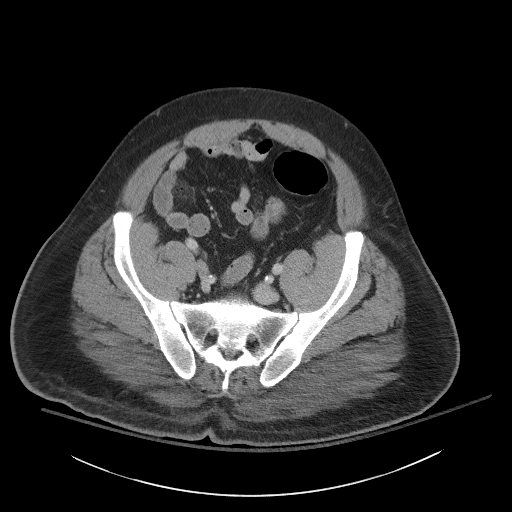
[im 49/109  soft-tissue]
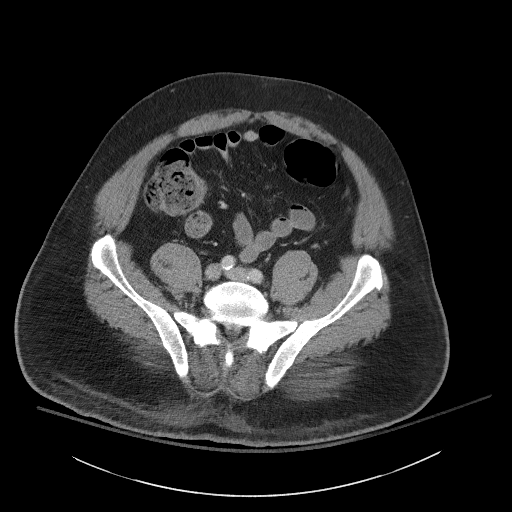
[im 60/109  soft-tissue]
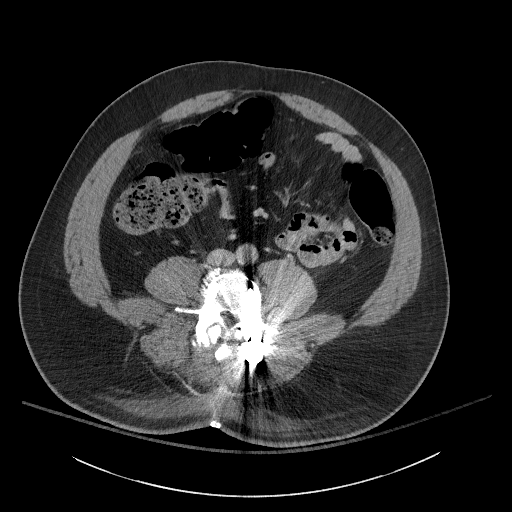
[im 65/109  soft-tissue]
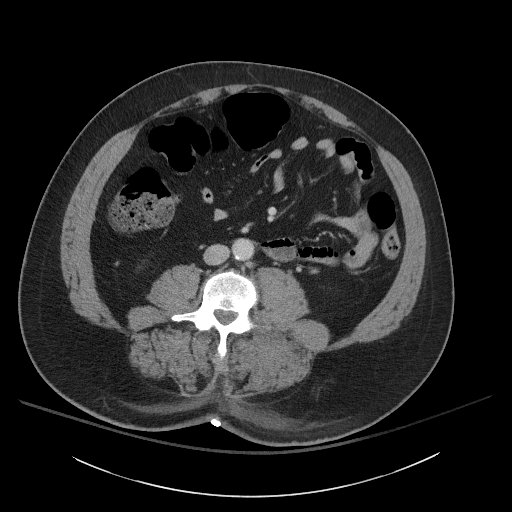
[im 76/109  soft-tissue]
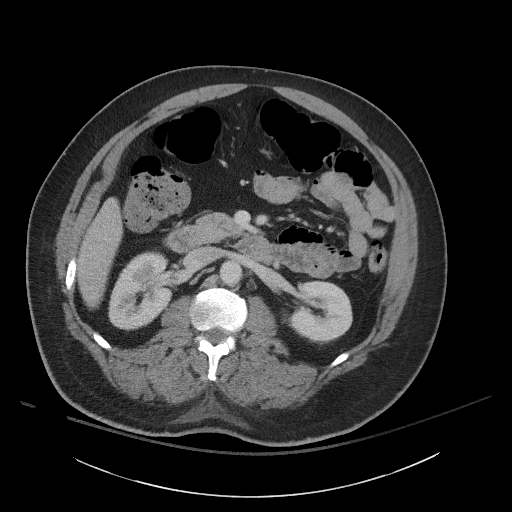
[im 76/109  bone]
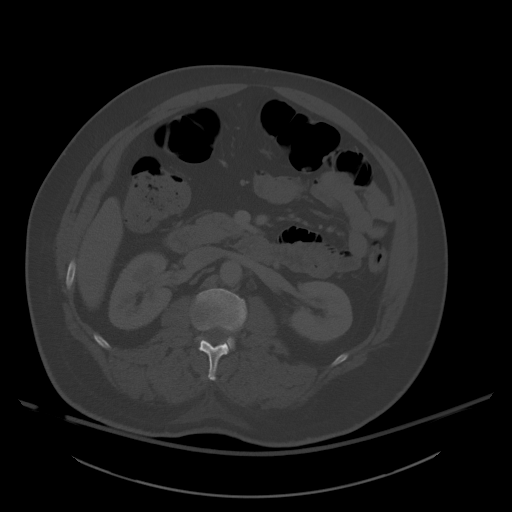
[im 87/109  soft-tissue]
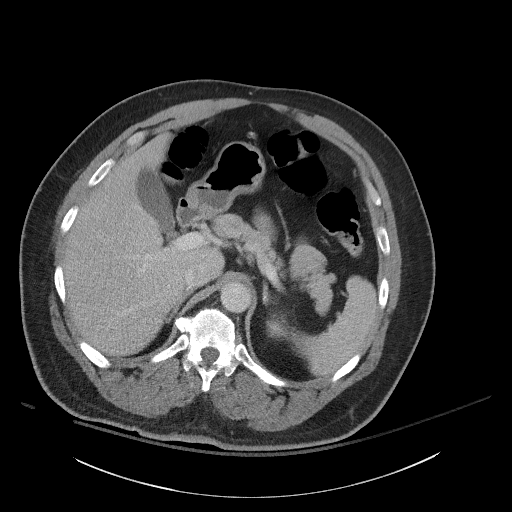
[im 92/109  soft-tissue]
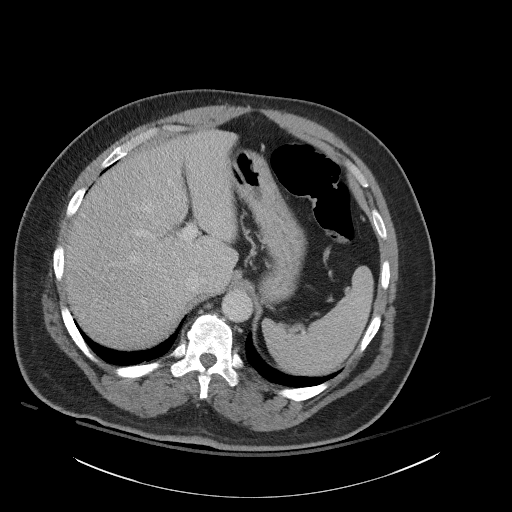
[im 103/109  soft-tissue]
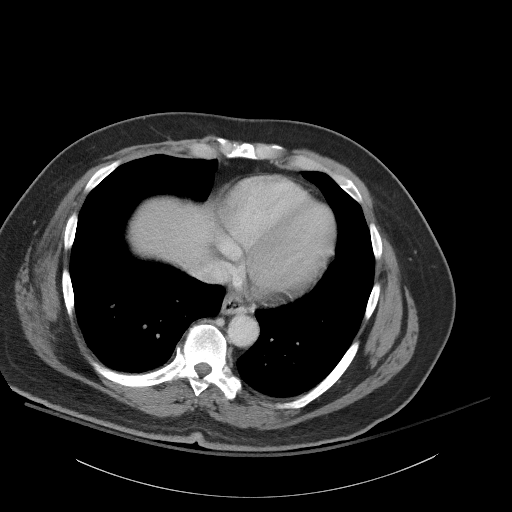

[Series 8: thins · coronal · 0.41mm/px · 3 of 351 slices shown]
[im 101/351  soft-tissue]
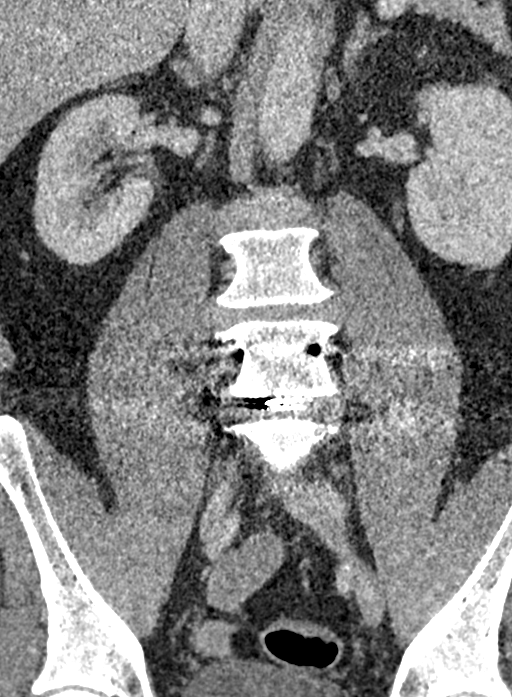
[im 151/351  soft-tissue]
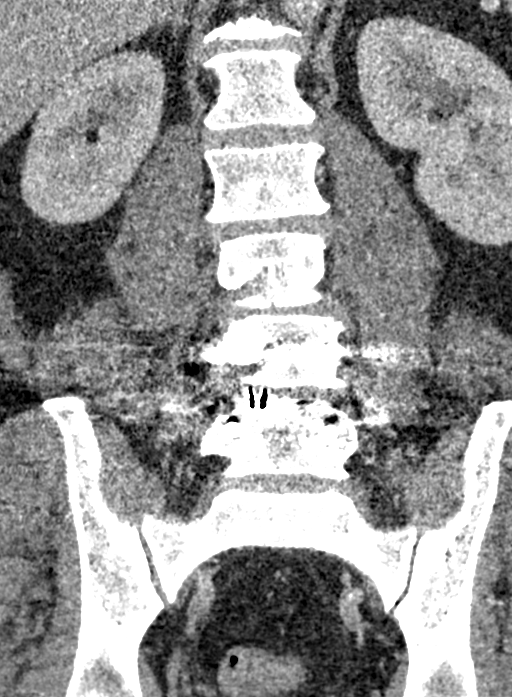
[im 201/351  soft-tissue]
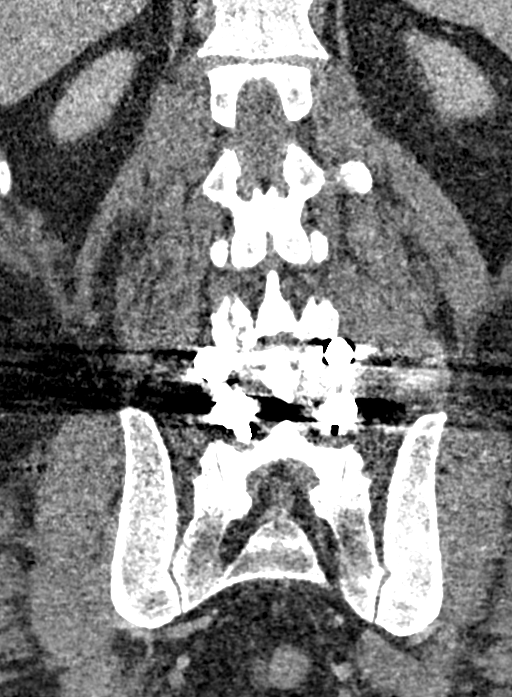

[15 of 46 positions shown; findings below may reference images not displayed]

FINDINGS: ABDOMEN AND PELVIS CT

Lower chest: Clear lung bases.  Heart normal in size.

Hepatobiliary: No focal liver abnormality is seen. No gallstones,
gallbladder wall thickening, or biliary dilatation.

Pancreas: Unremarkable. No pancreatic ductal dilatation or
surrounding inflammatory changes.

Spleen: Normal in size without focal abnormality.

Adrenals/Urinary Tract: Adrenal glands are unremarkable. Kidneys are
normal, without renal calculi, focal lesion, or hydronephrosis.
Bladder is unremarkable.

Stomach/Bowel: Stomach is within normal limits. Appendix appears
normal. No evidence of bowel wall thickening, distention, or
inflammatory changes.

Vascular/Lymphatic: No significant vascular findings are present. No
enlarged abdominal or pelvic lymph nodes.

Reproductive: Normal sized prostate gland.

Other: No abdominal wall hernia or abnormality. No abdominopelvic
ascites.

Musculoskeletal: Status post L4-L5 posterior fusion, described under
the lumbar spine CT section. No fracture. No bone lesion.

LUMBAR SPINE CT

Segmentation: 5 lumbar type vertebra.

Alignment: Slight, approximately 2-3 mm, anterolisthesis of L3 on
L4. No other malalignment.

Osseous structures: No fracture or bone lesion. Bilateral pedicle
screws at L4-L5 with intact interconnecting rods. The pedicle screws
are well positioned and well-seated. There is a radiolucent disc
spacer well centered at the L4-L5 disc interspace.

Adjacent soft tissues: Artifact from the fusion hardware somewhat
limits assessment the soft tissues at the L4 and L5 levels. No
convincing fluid collection to suggest an abscess. There is
posterior subcutaneous edema.

Disc levels:

T12-L1: Minor disc bulging.  No stenosis.  No disc herniation.

L1-L2: Normal.

L2-L3: Minor disc bulging. No disc herniation. No significant
stenosis.

L3-L4: Moderate facet degenerative change, minor disc bulging and
the grade 1 anterolisthesis combining to cause mild central stenosis
narrowing the AP diameter of the canal to 1 cm, and mild
superolateral recess narrowing and mild bilateral neural foraminal
narrowing.

L4-L5: Limited assessment of the fused level due to metallic
hardware artifact. No convincing spinal canal collection or
hematoma.

L5-S1: Unremarkable.
IMPRESSION: CT ABDOMEN AND PELVIS

1. No acute findings within the abdomen or pelvis.
2. Status post L4-L5 posterior lumbar spine fusion. No other
abnormalities.

CT LUMBAR SPINE

1. Well-positioned L4-L5 posterior lumbar spine fusion hardware. No
evidence of an operative complication.
2. Disc and facet degenerative changes at L3-L4 with mild
tricompartmental stenosis.

## 2023-02-02 ENCOUNTER — Emergency Department (HOSPITAL_BASED_OUTPATIENT_CLINIC_OR_DEPARTMENT_OTHER)
Admission: EM | Admit: 2023-02-02 | Discharge: 2023-02-02 | Disposition: A | Discharge status: Home / Self Care | Payer: Medicare Other | Attending: Emergency Medicine | Admitting: Emergency Medicine

## 2023-02-02 ENCOUNTER — Other Ambulatory Visit: Payer: Self-pay

## 2023-02-02 ENCOUNTER — Encounter (HOSPITAL_BASED_OUTPATIENT_CLINIC_OR_DEPARTMENT_OTHER): Payer: Self-pay | Admitting: Emergency Medicine

## 2023-02-02 ENCOUNTER — Emergency Department (HOSPITAL_BASED_OUTPATIENT_CLINIC_OR_DEPARTMENT_OTHER): Payer: Medicare Other

## 2023-02-02 DIAGNOSIS — Z79899 Other long term (current) drug therapy: Secondary | ICD-10-CM | POA: Diagnosis not present

## 2023-02-02 DIAGNOSIS — E119 Type 2 diabetes mellitus without complications: Secondary | ICD-10-CM | POA: Insufficient documentation

## 2023-02-02 DIAGNOSIS — M545 Low back pain, unspecified: Secondary | ICD-10-CM | POA: Insufficient documentation

## 2023-02-02 DIAGNOSIS — G8929 Other chronic pain: Secondary | ICD-10-CM | POA: Diagnosis not present

## 2023-02-02 DIAGNOSIS — I10 Essential (primary) hypertension: Secondary | ICD-10-CM | POA: Insufficient documentation

## 2023-02-02 MED ORDER — IBUPROFEN 800 MG PO TABS
800.0000 mg | ORAL_TABLET | Freq: Once | ORAL | Status: AC
  Administered 2023-02-02: 800 mg via ORAL
  Filled 2023-02-02: qty 1

## 2023-02-02 MED ORDER — IBUPROFEN 800 MG PO TABS: 800.0000 mg | ORAL_TABLET | Freq: Three times a day (TID) | ORAL | 0 refills | Status: DC | PRN

## 2023-02-02 MED ORDER — LIDOCAINE-EPINEPHRINE-TETRACAINE (LET) TOPICAL GEL: 3.0000 mL | Freq: Once | TOPICAL | Status: DC

## 2023-02-02 MED ORDER — METHOCARBAMOL 500 MG PO TABS: 500.0000 mg | ORAL_TABLET | Freq: Two times a day (BID) | ORAL | 0 refills | Status: DC | PRN

## 2023-02-02 MED ORDER — METHOCARBAMOL 500 MG PO TABS
500.0000 mg | ORAL_TABLET | Freq: Once | ORAL | Status: AC
  Administered 2023-02-02: 500 mg via ORAL
  Filled 2023-02-02: qty 1

## 2023-02-02 MED ORDER — LIDOCAINE 5 % EX PTCH
1.0000 | MEDICATED_PATCH | CUTANEOUS | Status: DC
  Administered 2023-02-02: 1 via TRANSDERMAL
  Filled 2023-02-02: qty 1

## 2023-02-02 MED ORDER — HYDROCODONE-ACETAMINOPHEN 5-325 MG PO TABS
1.0000 | ORAL_TABLET | Freq: Once | ORAL | Status: AC
  Administered 2023-02-02: 1 via ORAL
  Filled 2023-02-02: qty 1

## 2023-02-02 NOTE — ED Provider Notes (Signed)
EMERGENCY DEPARTMENT AT [facility] Provider Note   CSN: [id number] Arrival date & time: 02/02/23  1758     History  Chief Complaint  Patient presents with   Back Pain    Jason Meadows is a 62 y.o. male.  Patient is a 62 year old male with past medical history of hypertension, hyperlipidemia, diabetes, and L4-L5 posterior lumbar spine fusion with chronic lower back pain presenting for acute on chronic lumbar back pain.  Patient denies any recent falls or traumas.  Admits to low back pain, worse on the left side, without radiation.  The history is provided by the patient. No language interpreter was used.  Back Pain Associated symptoms: no abdominal pain, no chest pain, no dysuria and no fever        Home Medications Prior to Admission medications   Medication Sig Start Date End Date Taking? Authorizing Provider  ibuprofen (ADVIL) 800 MG tablet Take 1 tablet (800 mg total) by mouth every 8 (eight) hours as needed for mild pain. 02/02/23  Yes Edwin Dada P, DO  methocarbamol (ROBAXIN) 500 MG tablet Take 1 tablet (500 mg total) by mouth every 12 (twelve) hours as needed for muscle spasms. 02/02/23  Yes Edwin Dada P, DO  atorvastatin (LIPITOR) 10 MG tablet atorvastatin 10 mg tablet 09/18/16   [provider]  lidocaine (LIDODERM) 5 % Place 1 patch onto the skin daily. Remove & Discard patch within 12 hours or as directed by MD 09/17/16   Nicanor Alcon, April, MD  lisinopril-hydrochlorothiazide (ZESTORETIC) 10-12.5 MG tablet Take 1 tablet by mouth daily. 12/04/19   [provider]  naproxen (NAPROSYN) 500 MG tablet Take 1 tablet (500 mg total) by mouth 2 (two) times daily. 09/15/16   Felicie Morn, NP  oxyCODONE-acetaminophen Pincus Large) 5-325 MG/5ML solution SMARTSIG:1 Tablet(s) By Mouth Every 4 Hours PRN 06/26/19   [provider]      Allergies    Patient has no known allergies.    Review of Systems   Review of Systems  Constitutional:   Negative for chills and fever.  HENT:  Negative for ear pain and sore throat.   Eyes:  Negative for pain and visual disturbance.  Respiratory:  Negative for cough and shortness of breath.   Cardiovascular:  Negative for chest pain and palpitations.  Gastrointestinal:  Negative for abdominal pain and vomiting.  Genitourinary:  Negative for dysuria and hematuria.  Musculoskeletal:  Positive for back pain. Negative for arthralgias.  Skin:  Negative for color change and rash.  Neurological:  Negative for seizures and syncope.  All other systems reviewed and are negative.   Physical Exam Updated Vital Signs BP (!) 154/89   Pulse 62   Temp 98 F (36.7 C)   Resp 18   Ht 5\' 10"  (1.778 m)   Wt 106.1 kg   SpO2 100%   BMI 33.58 kg/m  Physical Exam Vitals and nursing note reviewed.  Constitutional:      General: He is not in acute distress.    Appearance: He is well-developed.  HENT:     Head: Normocephalic and atraumatic.  Eyes:     Conjunctiva/sclera: Conjunctivae normal.  Cardiovascular:     Rate and Rhythm: Normal rate and regular rhythm.     Heart sounds: No murmur heard. Pulmonary:     Effort: Pulmonary effort is normal. No respiratory distress.     Breath sounds: Normal breath sounds.  Abdominal:     Palpations: Abdomen is soft.  Tenderness: There is no abdominal tenderness.  Musculoskeletal:        General: No swelling.     Cervical back: Neck supple. No tenderness or bony tenderness.     Thoracic back: No tenderness or bony tenderness.     Lumbar back: Tenderness present. No bony tenderness.       Back:  Skin:    General: Skin is warm and dry.     Capillary Refill: Capillary refill takes less than 2 seconds.  Neurological:     General: No focal deficit present.     Mental Status: He is alert and oriented to person, place, and time.     GCS: GCS eye subscore is 4. GCS verbal subscore is 5. GCS motor subscore is 6.     Sensory: Sensation is intact.     Motor:  Motor function is intact.  Psychiatric:        Mood and Affect: Mood normal.     ED Results / Procedures / Treatments   Labs (all labs ordered are listed, but only abnormal results are displayed) Labs Reviewed - No data to display  EKG None  Radiology No results found.  Procedures Procedures    Medications Ordered in ED Medications  HYDROcodone-acetaminophen (NORCO/VICODIN) 5-325 MG per tablet 1 tablet (1 tablet Oral Given 02/02/23 1932)  ibuprofen (ADVIL) tablet 800 mg (800 mg Oral Given 02/02/23 1932)  methocarbamol (ROBAXIN) tablet 500 mg (500 mg Oral Given 02/02/23 2150)    ED Course/ Medical Decision Making/ A&P                             Medical Decision Making Amount and/or Complexity of Data Reviewed Radiology: ordered.  Risk Prescription drug management.   62 year old male with past medical history of hypertension, hyperlipidemia, diabetes, and L4-L5 posterior lumbar spine fusion with chronic lower back pain presenting for acute on chronic lumbar back pain.  Patient is alert and oriented x 3, no acute distress, afebrile, stable vital signs.  Physical exam demonstrates no midline spinal tenderness. No neurovascular deficits. Muscle spasm and tenderness of the left lumbar paraspinal muscles. No signs of cauda equina syndrome, spinal epidural abscess, transverse myelitis, hx of trauma, or concerning hx/physical for cancerous etiology.Norco and Motrin 800 given for pain.  Pending CT.  CT demonstrates no acute changes. Patient's pain is controlled at this time. Recommended for close f/u with pcp for mri in the future if pain continues.   Patient in no distress and overall condition improved here in the ED. Detailed discussions were had with the patient regarding current findings, and need for close f/u with PCP or on call doctor. The patient has been instructed to return immediately if the symptoms worsen in any way for re-evaluation. Patient verbalized understanding and  is in agreement with current care plan. All questions answered prior to discharge.          Final Clinical Impression(s) / ED Diagnoses Final diagnoses:  Acute on chronic left-sided low back pain without sciatica    Rx / DC Orders ED Discharge Orders          Ordered    methocarbamol (ROBAXIN) 500 MG tablet  Every 12 hours PRN        02/02/23 2123    ibuprofen (ADVIL) 800 MG tablet  Every 8 hours PRN        02/02/23 2123  Edwin Dada P, DO 02/10/23 [phone]

## 2023-02-02 NOTE — ED Triage Notes (Signed)
Patient presents to ED via POV from home. Here with chronic back pain. Denies recent injury or trauma. Denies urinary incontinence.

## 2023-06-06 ENCOUNTER — Encounter (HOSPITAL_BASED_OUTPATIENT_CLINIC_OR_DEPARTMENT_OTHER): Payer: Self-pay

## 2023-06-06 ENCOUNTER — Emergency Department (HOSPITAL_BASED_OUTPATIENT_CLINIC_OR_DEPARTMENT_OTHER)
Admission: EM | Admit: 2023-06-06 | Discharge: 2023-06-06 | Disposition: A | Discharge status: Home / Self Care | Payer: Medicare Other | Attending: Emergency Medicine | Admitting: Emergency Medicine

## 2023-06-06 ENCOUNTER — Other Ambulatory Visit: Payer: Self-pay

## 2023-06-06 DIAGNOSIS — I10 Essential (primary) hypertension: Secondary | ICD-10-CM | POA: Diagnosis not present

## 2023-06-06 DIAGNOSIS — L0201 Cutaneous abscess of face: Secondary | ICD-10-CM | POA: Insufficient documentation

## 2023-06-06 DIAGNOSIS — L0291 Cutaneous abscess, unspecified: Secondary | ICD-10-CM

## 2023-06-06 DIAGNOSIS — E119 Type 2 diabetes mellitus without complications: Secondary | ICD-10-CM | POA: Diagnosis not present

## 2023-06-06 LAB — CBG MONITORING, ED: Glucose-Capillary: 196 mg/dL — ABNORMAL HIGH (ref 70–99)

## 2023-06-06 MED ORDER — LIDOCAINE-EPINEPHRINE (PF) 2 %-1:200000 IJ SOLN
10.0000 mL | Freq: Once | INTRAMUSCULAR | Status: AC
  Administered 2023-06-06: 10 mL
  Filled 2023-06-06: qty 20

## 2023-06-06 MED ORDER — DOXYCYCLINE HYCLATE 100 MG PO TABS
100.0000 mg | ORAL_TABLET | Freq: Once | ORAL | Status: AC
  Administered 2023-06-06: 100 mg via ORAL
  Filled 2023-06-06: qty 1

## 2023-06-06 MED ORDER — DOXYCYCLINE HYCLATE 100 MG PO CAPS: 100.0000 mg | ORAL_CAPSULE | Freq: Two times a day (BID) | ORAL | 0 refills | Status: DC

## 2023-06-06 NOTE — ED Provider Notes (Signed)
[place] EMERGENCY DEPARTMENT AT [facility] Provider Note   CSN: [id number] Arrival date & time: 06/06/23  0827     History  Chief Complaint  Patient presents with   Abscess    Jason Meadows is a 62 y.o. male.  Pt is a 62 yo male with pmhx significant for dm, htn, and hld.  Pt said he has noticed an abscess to his left forehead for about a week.  It is getting bigger and more painful.         Home Medications Prior to Admission medications   Medication Sig Start Date End Date Taking? Authorizing Provider  doxycycline (VIBRAMYCIN) 100 MG capsule Take 1 capsule (100 mg total) by mouth 2 (two) times daily. 06/06/23  Yes Jacalyn Lefevre, MD  atorvastatin (LIPITOR) 10 MG tablet atorvastatin 10 mg tablet 09/18/16   [provider]  ibuprofen (ADVIL) 800 MG tablet Take 1 tablet (800 mg total) by mouth every 8 (eight) hours as needed for mild pain. 02/02/23   Edwin Dada P, DO  lidocaine (LIDODERM) 5 % Place 1 patch onto the skin daily. Remove & Discard patch within 12 hours or as directed by MD 09/17/16   Nicanor Alcon, April, MD  lisinopril-hydrochlorothiazide (ZESTORETIC) 10-12.5 MG tablet Take 1 tablet by mouth daily. 12/04/19   [provider]  methocarbamol (ROBAXIN) 500 MG tablet Take 1 tablet (500 mg total) by mouth every 12 (twelve) hours as needed for muscle spasms. 02/02/23   Edwin Dada P, DO  naproxen (NAPROSYN) 500 MG tablet Take 1 tablet (500 mg total) by mouth 2 (two) times daily. 09/15/16   Felicie Morn, NP  oxyCODONE-acetaminophen Pincus Large) 5-325 MG/5ML solution SMARTSIG:1 Tablet(s) By Mouth Every 4 Hours PRN 06/26/19   [provider]      Allergies    Patient has no known allergies.    Review of Systems   Review of Systems  Skin:        abscess  All other systems reviewed and are negative.   Physical Exam Updated Vital Signs BP (!) 135/94   Pulse (!) 53   Temp 97.8 F (36.6 C) (Oral)   Resp 18   Ht 5\' 11"  (1.803 m)   Wt  64.9 kg   SpO2 99%   BMI 19.94 kg/m  Physical Exam Vitals and nursing note reviewed.  Constitutional:      Appearance: Normal appearance.  HENT:     Head: Normocephalic and atraumatic.     Comments: 2 cm abscess left forehead    Right Ear: External ear normal.     Left Ear: External ear normal.     Nose: Nose normal.     Mouth/Throat:     Mouth: Mucous membranes are moist.     Pharynx: Oropharynx is clear.  Eyes:     Extraocular Movements: Extraocular movements intact.     Conjunctiva/sclera: Conjunctivae normal.     Pupils: Pupils are equal, round, and reactive to light.  Cardiovascular:     Rate and Rhythm: Normal rate and regular rhythm.     Pulses: Normal pulses.     Heart sounds: Normal heart sounds.  Pulmonary:     Effort: Pulmonary effort is normal.     Breath sounds: Normal breath sounds.  Abdominal:     General: Abdomen is flat. Bowel sounds are normal.     Palpations: Abdomen is soft.  Musculoskeletal:        General: Normal range of motion.  Cervical back: Normal range of motion and neck supple.  Skin:    General: Skin is warm.     Capillary Refill: Capillary refill takes less than 2 seconds.  Neurological:     General: No focal deficit present.     Mental Status: He is alert and oriented to person, place, and time.  Psychiatric:        Mood and Affect: Mood normal.        Behavior: Behavior normal.     ED Results / Procedures / Treatments   Labs (all labs ordered are listed, but only abnormal results are displayed) Labs Reviewed  CBG MONITORING, ED - Abnormal; Notable for the following components:      Result Value   Glucose-Capillary 196 (*)    All other components within normal limits    EKG None  Radiology No results found.  Procedures .Marland KitchenIncision and Drainage  Date/Time: 06/06/2023 9:09 AM  Performed by: Jacalyn Lefevre, MD Authorized by: Jacalyn Lefevre, MD   Consent:    Consent obtained:  Verbal   Consent given by:  Patient    Alternatives discussed:  No treatment Universal protocol:    Patient identity confirmed:  Verbally with patient Location:    Type:  Abscess   Size:  1   Location:  Head   Head location:  Face Pre-procedure details:    Skin preparation:  Povidone-iodine Sedation:    Sedation type:  None Anesthesia:    Anesthesia method:  Local infiltration   Local anesthetic:  Lidocaine 2% WITH epi Procedure type:    Complexity:  Simple Procedure details:    Ultrasound guidance: no     Needle aspiration: no     Incision types:  Single straight   Wound management:  Probed and deloculated   Drainage:  Purulent   Drainage amount:  Moderate   Wound treatment:  Wound left open   Packing materials:  None Post-procedure details:    Procedure completion:  Tolerated well, no immediate complications     Medications Ordered in ED Medications  lidocaine-EPINEPHrine (XYLOCAINE W/EPI) 2 %-1:200000 (PF) injection 10 mL (has no administration in time range)  doxycycline (VIBRA-TABS) tablet 100 mg (has no administration in time range)    ED Course/ Medical Decision Making/ A&P                                 Medical Decision Making Risk Prescription drug management.   This patient presents to the ED for concern of abscess, this involves an extensive number of treatment options, and is a complaint that carries with it a high risk of complications and morbidity.  The differential diagnosis includes abscess, cellulitis, sebaceous cyst   Co morbidities that complicate the patient evaluation  dm, htn, and hld   Additional history obtained:  Additional history obtained from epic chart review External records from outside source obtained and reviewed including wife   Lab Tests:  I Ordered, and personally interpreted labs.  The pertinent results include:  cbg 196   Medicines ordered and prescription drug management:  I ordered medication including doxy  for abscess  Reevaluation of the  patient after these medicines showed that the patient improved I have reviewed the patients home medicines and have made adjustments as needed   Problem List / ED Course:  Forehead abscess:  I&D'd in the ED.  Pt d/c with doxy. Hyperglycemia:  pt has not taken his  meds today.  He is encouraged to take meds as directed.  He is aware that elevated blood sugars can contribute to abscess formation and poor wound healing.   Reevaluation:  After the interventions noted above, I reevaluated the patient and found that they have :improved   Social Determinants of Health:  Lives at home   Dispostion:  After consideration of the diagnostic results and the patients response to treatment, I feel that the patent would benefit from discharge with outpatient f/u.          Final Clinical Impression(s) / ED Diagnoses Final diagnoses:  Abscess    Rx / DC Orders ED Discharge Orders          Ordered    doxycycline (VIBRAMYCIN) 100 MG capsule  2 times daily        06/06/23 0909              Jacalyn Lefevre, MD 06/06/23 [phone]

## 2023-06-06 NOTE — ED Triage Notes (Signed)
Pt reports abscess to left forehead x 1 week gradually getting bigger. Sore to touch

## 2023-12-02 ENCOUNTER — Observation Stay (HOSPITAL_COMMUNITY)
Admission: EM | Admit: 2023-12-02 | Discharge: 2023-12-04 | Disposition: A | Discharge status: Home / Self Care | Attending: Internal Medicine | Admitting: Internal Medicine

## 2023-12-02 ENCOUNTER — Other Ambulatory Visit: Payer: Self-pay

## 2023-12-02 ENCOUNTER — Encounter (HOSPITAL_COMMUNITY): Payer: Self-pay

## 2023-12-02 DIAGNOSIS — R29818 Other symptoms and signs involving the nervous system: Principal | ICD-10-CM | POA: Insufficient documentation

## 2023-12-02 DIAGNOSIS — N179 Acute kidney failure, unspecified: Secondary | ICD-10-CM | POA: Insufficient documentation

## 2023-12-02 DIAGNOSIS — Z96659 Presence of unspecified artificial knee joint: Secondary | ICD-10-CM | POA: Diagnosis not present

## 2023-12-02 DIAGNOSIS — F17210 Nicotine dependence, cigarettes, uncomplicated: Secondary | ICD-10-CM | POA: Diagnosis not present

## 2023-12-02 DIAGNOSIS — Z7984 Long term (current) use of oral hypoglycemic drugs: Secondary | ICD-10-CM | POA: Insufficient documentation

## 2023-12-02 DIAGNOSIS — E785 Hyperlipidemia, unspecified: Secondary | ICD-10-CM | POA: Insufficient documentation

## 2023-12-02 DIAGNOSIS — E119 Type 2 diabetes mellitus without complications: Secondary | ICD-10-CM | POA: Diagnosis not present

## 2023-12-02 DIAGNOSIS — I700 Atherosclerosis of aorta: Secondary | ICD-10-CM | POA: Insufficient documentation

## 2023-12-02 DIAGNOSIS — I639 Cerebral infarction, unspecified: Principal | ICD-10-CM

## 2023-12-02 DIAGNOSIS — R2990 Unspecified symptoms and signs involving the nervous system: Secondary | ICD-10-CM

## 2023-12-02 DIAGNOSIS — Z79899 Other long term (current) drug therapy: Secondary | ICD-10-CM | POA: Insufficient documentation

## 2023-12-02 DIAGNOSIS — R531 Weakness: Secondary | ICD-10-CM | POA: Diagnosis present

## 2023-12-02 DIAGNOSIS — I10 Essential (primary) hypertension: Secondary | ICD-10-CM | POA: Diagnosis not present

## 2023-12-02 NOTE — ED Provider Notes (Incomplete)
 [place] EMERGENCY DEPARTMENT AT [facility] Provider Note   CSN: [id number] Arrival date & time: 12/02/23  2313     History Chief Complaint  Patient presents with  . Extremity Weakness     Extremity Weakness   ALIEU FINNIGAN is a 63 y.o. male presenting for feeling off balance to his left side since 4 PM today.  He also states that he has some decreased sensation in the left upper and lower extremities in the same time.  He was brought in by EMS for the same, he states that since onset symptoms have not worsened however have not lessened either.  He denies having any pain, denies any headache, denies any nausea or vomiting, denies any dizziness or vertigo.  States it subjectively feels off balance though has not had any falls, and has not had any recent head injuries.  He also denies any recent illnesses in the last several weeks.  When symptoms started today, he was cutting the grass, and had to stop due to feeling like he was about to fall to his left side.  He denies any recent medication changes, and states he continues to take his lisinopril -HCTZ for hypertension as well as his Lipitor  for his cholesterol, however does not take any oral or injectable medications for diabetes.  History of diabetes, hypertension, and hyperlipidemia.  Currently taking atorvastatin , lisinopril -HCTZ  Patient's recorded medical, surgical, social, medication list and allergies were reviewed in the Snapshot window as part of the initial history.   Review of Systems   Review of Systems  Musculoskeletal:  Positive for extremity weakness.    Physical Exam Updated Vital Signs BP (!) 160/83 (BP Location: Right Arm)   Pulse 63   Temp 97.9 F (36.6 C) (Oral)   Resp (!) 22   Ht 5\' 11"  (1.803 m)   Wt 110.2 kg   SpO2 100%   BMI 33.89 kg/m  Physical Exam   ED Course/ Medical Decision Making/ A&P    Procedures Procedures   Medications Ordered in ED Medications - No data to  display  Medical Decision Making:   HUGO LYBRAND is a 63 y.o. male who presented to the ED today with *** detailed above.    {crccomplexity:27900} Complete initial physical exam performed, notably the patient  was ***.    Reviewed and confirmed nursing documentation for past medical history, family history, social history.    Initial Assessment:   With the patient's presentation of ***, most likely diagnosis is ***. Other diagnoses were considered including (but not limited to) ***. These are considered less likely due to history of present illness and physical exam findings.   {crccopa:27899}  Initial Plan:  ***  ***Screening labs including CBC and Metabolic panel to evaluate for infectious or metabolic etiology of disease.  ***Urinalysis with reflex culture ordered to evaluate for UTI or relevant urologic/nephrologic pathology.  ***CXR to evaluate for structural/infectious intrathoracic pathology.  {crccardiactesting:32591::"EKG to evaluate for cardiac pathology"} Objective evaluation as below reviewed   Initial Study Results:   Laboratory  All laboratory results reviewed without evidence of clinically relevant pathology.   ***Exceptions include: ***   ***EKG EKG was reviewed independently. Rate, rhythm, axis, intervals all examined and without medically relevant abnormality. ST segments without concerns for elevations.    Radiology:  All images reviewed independently. ***Agree with radiology report at this time.   No results found.    Consults: Case discussed with ***.   Reassessment and Plan:   ***    ***  Clinical Impression: No diagnosis found.   Data Unavailable   Final Clinical Impression(s) / ED Diagnoses Final diagnoses:  None    Rx / DC Orders ED Discharge Orders     None

## 2023-12-02 NOTE — ED Triage Notes (Signed)
 Pt BIBEMS from home came to ED c/o left upper and lower extremity numbness/weakness around 4pm today, on arrival, symptoms resolved except for left arm numbness

## 2023-12-02 NOTE — ED Provider Notes (Addendum)
 [place] [facility] AT [facility] Provider Note   CSN: [id number] Arrival date & time: 12/02/23  2313     History Chief Complaint  Patient presents with   Extremity Weakness     Extremity Weakness Pertinent negatives include no headaches.   Jason Meadows is a 63 y.o. male presenting for feeling off balance to his left side since 4 PM today.  He also states that he has some decreased sensation in the left upper and lower extremities in the same time.  He was brought in by EMS for the same, he states that since onset symptoms have not worsened however have not lessened either.  He denies having any pain, denies any headache, denies any nausea or vomiting, denies any dizziness or vertigo.  States it subjectively feels off balance though has not had any falls, and has not had any recent head injuries.  He also denies any recent illnesses in the last several weeks.  When symptoms started today, he was cutting the grass, and had to stop due to feeling like he was about to fall to his left side.  He denies any recent medication changes, and states he continues to take his lisinopril-HCTZ for hypertension as well as his Lipitor for his cholesterol, however does not take any oral or injectable medications for diabetes.  History of diabetes, hypertension, and hyperlipidemia.  Currently taking atorvastatin , lisinopril-HCTZ  Patient's recorded medical, surgical, social, medication list and allergies were reviewed in the Snapshot window as part of the initial history.   Review of Systems   Review of Systems  HENT:  Negative for ear discharge, ear pain, facial swelling, hearing loss and tinnitus.   Musculoskeletal:  Positive for extremity weakness.  Neurological:  Positive for numbness. Negative for facial asymmetry, speech difficulty, weakness, light-headedness and headaches.    Physical Exam Updated Vital Signs BP 126/67   Pulse (!) 57   Temp 97.9 F (36.6 C)  (Oral)   Resp 17   Ht 5\' 11"  (1.803 m)   Wt 110.2 kg   SpO2 100%   BMI 33.89 kg/m  Physical Exam Vitals and nursing note reviewed.  Constitutional:      General: He is not in acute distress.    Appearance: Normal appearance.  HENT:     Head: Normocephalic and atraumatic.     Right Ear: Tympanic membrane, ear canal and external ear normal.     Left Ear: Ear canal and external ear normal.     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    Mouth/Throat:     Mouth: Mucous membranes are moist.     Pharynx: Oropharynx is clear.  Eyes:     Extraocular Movements: Extraocular movements intact.     Conjunctiva/sclera: Conjunctivae normal.     Pupils: Pupils are equal, round, and reactive to light.  Cardiovascular:     Rate and Rhythm: Normal rate and regular rhythm.     Pulses: Normal pulses.     Heart sounds: Normal heart sounds. No murmur heard.    No friction rub. No gallop.  Pulmonary:     Effort: Pulmonary effort is normal.     Breath sounds: Normal breath sounds.  Abdominal:     General: Abdomen is flat. Bowel sounds are normal.     Palpations: Abdomen is soft.  Musculoskeletal:        General: Normal range of motion.  Cervical back: Normal range of motion and neck supple.     Right lower leg: No edema.     Left lower leg: No edema.  Skin:    General: Skin is warm and dry.     Capillary Refill: Capillary refill takes less than 2 seconds.  Neurological:     General: No focal deficit present.     Mental Status: He is alert and oriented to person, place, and time.     GCS: GCS eye subscore is 4. GCS verbal subscore is 5. GCS motor subscore is 6.     Cranial Nerves: Cranial nerves 2-12 are intact. No cranial nerve deficit.     Sensory: Sensory deficit present.     Motor: No abnormal muscle tone or pronator drift.     Coordination: Romberg sign positive. Coordination abnormal.     Gait: Gait abnormal.     Comments: With  ambulation, noted to be ataxic gait with steps taken on left being weaker than the right.  Psychiatric:        Mood and Affect: Mood normal.      ED Course/ Medical Decision Making/ A&P    Procedures Procedures   Medications Ordered in ED Medications  acetaminophen  (TYLENOL ) tablet 1,000 mg (1,000 mg Oral Given 12/03/23 0117)  iohexol  (OMNIPAQUE ) 350 MG/ML injection 75 mL (75 mLs Intravenous Contrast Given 12/03/23 0139)    Medical Decision Making:   Jason Meadows is a 63 y.o. male who presented to the ED today with left-sided sequently premorbid left-sided decreased sensation to the upper and lower extremities detailed above.    Patient's presentation is complicated by their history of hypertension and diabetes along with hyperlipidemia.  Complete initial physical exam performed, notably the patient  was showing signs of decree sensation across the entirety of the left upper extremity and left thigh and calf.  Further demonstrated ataxic gait.     Reviewed and confirmed nursing documentation for past medical history, family history, social history.    Initial Assessment:   With the patient's presentation of left-sided disequilibrium and decree sensation to the left, most likely diagnosis is potential vascular occlusion or dissection. Other diagnoses were considered including (but not limited to) vestibular neuritis, and Meniere's disease. These are considered less likely due to history of present illness and physical exam findings.   This is most consistent with an acute complicated illness  Initial Plan:  Obtain CT angiography to address any potential cerebrovascular occlusions or dissections Screening labs including CBC and Metabolic panel to evaluate for infectious or metabolic etiology of disease.  Urinalysis with reflex culture ordered to evaluate for UTI or relevant urologic/nephrologic pathology. .  EKG to evaluate for cardiac pathology Objective evaluation as below  reviewed   Initial Study Results:   Laboratory  All laboratory results reviewed without evidence of clinically relevant pathology.   Exceptions include:    EKG EKG was reviewed independently. Rate, rhythm, axis, intervals all examined and without medically relevant abnormality. ST segments without concerns for elevations.    Radiology:  All images reviewed independently. Agree with radiology report at this time.   CT ANGIO HEAD NECK W WO CM Result Date: 12/03/2023 CLINICAL DATA:  Acute neurologic deficit. Left upper and lower extremity weakness EXAM: CT ANGIOGRAPHY HEAD AND NECK WITH AND WITHOUT CONTRAST TECHNIQUE: Multidetector CT imaging of the head and neck was performed using the standard protocol during bolus administration of intravenous contrast. Multiplanar CT image reconstructions and MIPs were obtained to evaluate the  vascular anatomy. Carotid stenosis measurements (when applicable) are obtained utilizing NASCET criteria, using the distal internal carotid diameter as the denominator. RADIATION DOSE REDUCTION: This exam was performed according to the departmental dose-optimization program which includes automated exposure control, adjustment of the mA and/or kV according to patient size and/or use of iterative reconstruction technique. CONTRAST:  75mL OMNIPAQUE  IOHEXOL  350 MG/ML SOLN COMPARISON:  None Available. FINDINGS: CT HEAD FINDINGS Brain: There is no mass, hemorrhage or extra-axial collection. The size and configuration of the ventricles and extra-axial CSF spaces are normal. There is hypoattenuation of the white matter, most commonly indicating chronic small vessel disease. Vascular: No hyperdense vessel or unexpected vascular calcification. Skull: The visualized skull base, calvarium and extracranial soft tissues are normal. Sinuses/Orbits: No fluid levels or advanced mucosal thickening of the visualized paranasal sinuses. No mastoid or middle ear effusion. Normal orbits. CTA NECK  FINDINGS Skeleton: No acute abnormality or high grade bony spinal canal stenosis. Other neck: Normal pharynx, larynx and major salivary glands. No cervical lymphadenopathy. Unremarkable thyroid gland. Upper chest: No pneumothorax or pleural effusion. No nodules or masses. Aortic arch: There is calcific atherosclerosis of the aortic arch. Conventional 3 vessel aortic branching pattern. RIGHT carotid system: Normal without aneurysm, dissection or stenosis. LEFT carotid system: Normal without aneurysm, dissection or stenosis. Vertebral arteries: Left dominant configuration. There is no dissection, occlusion or flow-limiting stenosis to the skull base (V1-V3 segments). CTA HEAD FINDINGS POSTERIOR CIRCULATION: Moderate stenosis of the distal left V4 segment. No proximal occlusion of the anterior or inferior cerebellar arteries. Basilar artery is normal. Superior cerebellar arteries are normal. Posterior cerebral arteries are normal. ANTERIOR CIRCULATION: Intracranial internal carotid arteries are normal. Anterior cerebral arteries are normal. Middle cerebral arteries are normal. Venous sinuses: As permitted by contrast timing, patent. Anatomic variants: None Review of the MIP images confirms the above findings. IMPRESSION: 1. No emergent large vessel occlusion or high-grade stenosis of the intracranial arteries. 2. Chronic small vessel disease. Aortic Atherosclerosis (ICD10-I70.0). Electronically Signed   By: Juanetta Nordmann M.D.   On: 12/03/2023 01:51      Consults: Case discussed with neurology attending. Further discussion with hospitalist, Dr Sundil, who will admit for neuro.  Reassessment and Plan:   Per discussion with neurology, patient will be admitted to medicine and continue to be managed for CVA.  Orders placed for MRI without contrast, and admission orders being placed.     Clinical Impression:  1. Cerebrovascular accident (CVA), unspecified mechanism (HCC)      Admit   Final Clinical  Impression(s) / ED Diagnoses Final diagnoses:  Cerebrovascular accident (CVA), unspecified mechanism [facility])    Rx / DC Orders ED Discharge Orders     None         Juanetta Nordmann, PA 12/03/23 0312    Juanetta Nordmann, PA 12/03/23 0344    Lindle Rhea, MD 12/03/23 [phone]

## 2023-12-03 ENCOUNTER — Observation Stay (HOSPITAL_COMMUNITY)

## 2023-12-03 ENCOUNTER — Encounter (HOSPITAL_COMMUNITY): Payer: Self-pay | Admitting: Internal Medicine

## 2023-12-03 ENCOUNTER — Emergency Department (HOSPITAL_COMMUNITY)

## 2023-12-03 ENCOUNTER — Other Ambulatory Visit (HOSPITAL_COMMUNITY)

## 2023-12-03 DIAGNOSIS — N179 Acute kidney failure, unspecified: Secondary | ICD-10-CM

## 2023-12-03 DIAGNOSIS — R2990 Unspecified symptoms and signs involving the nervous system: Secondary | ICD-10-CM

## 2023-12-03 DIAGNOSIS — F17210 Nicotine dependence, cigarettes, uncomplicated: Secondary | ICD-10-CM | POA: Diagnosis not present

## 2023-12-03 DIAGNOSIS — I10 Essential (primary) hypertension: Secondary | ICD-10-CM

## 2023-12-03 DIAGNOSIS — E7849 Other hyperlipidemia: Secondary | ICD-10-CM

## 2023-12-03 DIAGNOSIS — E785 Hyperlipidemia, unspecified: Secondary | ICD-10-CM | POA: Insufficient documentation

## 2023-12-03 DIAGNOSIS — I639 Cerebral infarction, unspecified: Secondary | ICD-10-CM | POA: Diagnosis not present

## 2023-12-03 DIAGNOSIS — E119 Type 2 diabetes mellitus without complications: Secondary | ICD-10-CM

## 2023-12-03 LAB — LIPID PANEL
Cholesterol: 209 mg/dL — ABNORMAL HIGH (ref 0–200)
HDL: 18 mg/dL — ABNORMAL LOW (ref >40)
LDL Cholesterol: 166 mg/dL — ABNORMAL HIGH (ref 0–99)
Total CHOL/HDL Ratio: 11.6 ratio
Triglycerides: 127 mg/dL (ref <150)
VLDL: 25 mg/dL (ref 0–40)

## 2023-12-03 LAB — COMPREHENSIVE METABOLIC PANEL WITH GFR
ALT: 20 U/L (ref 0–44)
AST: 24 U/L (ref 15–41)
Albumin: 3.7 g/dL (ref 3.5–5.0)
Alkaline Phosphatase: 82 U/L (ref 38–126)
Anion gap: 10 (ref 5–15)
BUN: 9 mg/dL (ref 8–23)
CO2: 26 mmol/L (ref 22–32)
Calcium: 9 mg/dL (ref 8.9–10.3)
Chloride: 102 mmol/L (ref 98–111)
Creatinine, Ser: 1.32 mg/dL — ABNORMAL HIGH (ref 0.61–1.24)
GFR, Estimated: >60 mL/min (ref >60)
Glucose, Bld: 180 mg/dL — ABNORMAL HIGH (ref 70–99)
Potassium: 3.5 mmol/L (ref 3.5–5.1)
Sodium: 138 mmol/L (ref 135–145)
Total Bilirubin: 0.8 mg/dL (ref 0.0–1.2)
Total Protein: 6.7 g/dL (ref 6.5–8.1)

## 2023-12-03 LAB — BASIC METABOLIC PANEL WITH GFR
Anion gap: 11 (ref 5–15)
BUN: 10 mg/dL (ref 8–23)
CO2: 24 mmol/L (ref 22–32)
Calcium: 8.8 mg/dL — ABNORMAL LOW (ref 8.9–10.3)
Chloride: 101 mmol/L (ref 98–111)
Creatinine, Ser: 1.27 mg/dL — ABNORMAL HIGH (ref 0.61–1.24)
GFR, Estimated: >60 mL/min (ref >60)
Glucose, Bld: 145 mg/dL — ABNORMAL HIGH (ref 70–99)
Potassium: 3.5 mmol/L (ref 3.5–5.1)
Sodium: 136 mmol/L (ref 135–145)

## 2023-12-03 LAB — I-STAT CHEM 8, ED
BUN: 11 mg/dL (ref 8–23)
Calcium, Ion: 1.08 mmol/L — ABNORMAL LOW (ref 1.15–1.40)
Chloride: 101 mmol/L (ref 98–111)
Creatinine, Ser: 1.4 mg/dL — ABNORMAL HIGH (ref 0.61–1.24)
Glucose, Bld: 176 mg/dL — ABNORMAL HIGH (ref 70–99)
HCT: 46 % (ref 39.0–52.0)
Hemoglobin: 15.6 g/dL (ref 13.0–17.0)
Potassium: 4.6 mmol/L (ref 3.5–5.1)
Sodium: 139 mmol/L (ref 135–145)
TCO2: 29 mmol/L (ref 22–32)

## 2023-12-03 LAB — GLUCOSE, CAPILLARY: Glucose-Capillary: 127 mg/dL — ABNORMAL HIGH (ref 70–99)

## 2023-12-03 LAB — PROTIME-INR
INR: 1.1 (ref 0.8–1.2)
Prothrombin Time: 14.7 s (ref 11.4–15.2)

## 2023-12-03 LAB — CBC
HCT: 43.8 % (ref 39.0–52.0)
HCT: 44.4 % (ref 39.0–52.0)
Hemoglobin: 14.4 g/dL (ref 13.0–17.0)
Hemoglobin: 14.9 g/dL (ref 13.0–17.0)
MCH: 28.5 pg (ref 26.0–34.0)
MCH: 29.1 pg (ref 26.0–34.0)
MCHC: 32.9 g/dL (ref 30.0–36.0)
MCHC: 33.6 g/dL (ref 30.0–36.0)
MCV: 86.7 fL (ref 80.0–100.0)
MCV: 86.7 fL (ref 80.0–100.0)
Platelets: 182 10*3/uL (ref 150–400)
Platelets: 200 10*3/uL (ref 150–400)
RBC: 5.05 MIL/uL (ref 4.22–5.81)
RBC: 5.12 MIL/uL (ref 4.22–5.81)
RDW: 14.2 % (ref 11.5–15.5)
RDW: 14.3 % (ref 11.5–15.5)
WBC: 6.4 10*3/uL (ref 4.0–10.5)
WBC: 6.9 10*3/uL (ref 4.0–10.5)
nRBC: 0 % (ref 0.0–0.2)
nRBC: 0 % (ref 0.0–0.2)

## 2023-12-03 LAB — DIFFERENTIAL
Abs Immature Granulocytes: 0.01 10*3/uL (ref 0.00–0.07)
Basophils Absolute: 0.1 10*3/uL (ref 0.0–0.1)
Basophils Relative: 1 %
Eosinophils Absolute: 0.2 10*3/uL (ref 0.0–0.5)
Eosinophils Relative: 3 %
Immature Granulocytes: 0 %
Lymphocytes Relative: 31 %
Lymphs Abs: 2.1 10*3/uL (ref 0.7–4.0)
Monocytes Absolute: 0.5 10*3/uL (ref 0.1–1.0)
Monocytes Relative: 7 %
Neutro Abs: 4 10*3/uL (ref 1.7–7.7)
Neutrophils Relative %: 58 %

## 2023-12-03 LAB — APTT: aPTT: 28 s (ref 24–36)

## 2023-12-03 LAB — URINALYSIS, ROUTINE W REFLEX MICROSCOPIC
Bacteria, UA: NONE SEEN
Bilirubin Urine: NEGATIVE
Glucose, UA: >=500 mg/dL — AB
Hgb urine dipstick: NEGATIVE
Ketones, ur: NEGATIVE mg/dL
Leukocytes,Ua: NEGATIVE
Nitrite: NEGATIVE
Protein, ur: NEGATIVE mg/dL
Specific Gravity, Urine: >1.046 — ABNORMAL HIGH (ref 1.005–1.030)
pH: 5 (ref 5.0–8.0)

## 2023-12-03 LAB — CBG MONITORING, ED
Glucose-Capillary: 118 mg/dL — ABNORMAL HIGH (ref 70–99)
Glucose-Capillary: 115 mg/dL — ABNORMAL HIGH (ref 70–99)
Glucose-Capillary: 174 mg/dL — ABNORMAL HIGH (ref 70–99)

## 2023-12-03 LAB — HIV ANTIBODY (ROUTINE TESTING W REFLEX): HIV Screen 4th Generation wRfx: NONREACTIVE

## 2023-12-03 LAB — ETHANOL: Alcohol, Ethyl (B): <15 mg/dL (ref <15)

## 2023-12-03 LAB — SODIUM, URINE, RANDOM: Sodium, Ur: 106 mmol/L

## 2023-12-03 MED ORDER — HYDRALAZINE HCL 20 MG/ML IJ SOLN
10.0000 mg | Freq: Four times a day (QID) | INTRAMUSCULAR | Status: DC | PRN
Start: 2023-12-03 — End: 2023-12-04
  Administered 2023-12-03 – 2023-12-04 (×2): 10 mg via INTRAVENOUS
  Filled 2023-12-03 (×2): qty 1

## 2023-12-03 MED ORDER — SODIUM CHLORIDE 0.9% FLUSH
3.0000 mL | Freq: Two times a day (BID) | INTRAVENOUS | Status: DC
  Administered 2023-12-03: 3 mL via INTRAVENOUS
  Administered 2023-12-03: 10 mL via INTRAVENOUS

## 2023-12-03 MED ORDER — ASPIRIN 81 MG PO TBEC: 81.0000 mg | DELAYED_RELEASE_TABLET | Freq: Every day | ORAL | Status: DC

## 2023-12-03 MED ORDER — NICOTINE 21 MG/24HR TD PT24
21.0000 mg | MEDICATED_PATCH | Freq: Every day | TRANSDERMAL | Status: DC
Start: 2023-12-03 — End: 2023-12-04
  Administered 2023-12-03 – 2023-12-04 (×2): 21 mg via TRANSDERMAL
  Filled 2023-12-03 (×2): qty 1

## 2023-12-03 MED ORDER — IOHEXOL 350 MG/ML SOLN
75.0000 mL | Freq: Once | INTRAVENOUS | Status: AC | PRN
  Administered 2023-12-03: 75 mL via INTRAVENOUS

## 2023-12-03 MED ORDER — ASPIRIN 81 MG PO CHEW
81.0000 mg | CHEWABLE_TABLET | Freq: Every day | ORAL | Status: DC
  Administered 2023-12-03 – 2023-12-04 (×2): 81 mg via ORAL
  Filled 2023-12-03 (×2): qty 1

## 2023-12-03 MED ORDER — ACETAMINOPHEN 160 MG/5ML PO SOLN: 650.0000 mg | ORAL | Status: DC | PRN

## 2023-12-03 MED ORDER — ATORVASTATIN CALCIUM 40 MG PO TABS: 40.0000 mg | ORAL_TABLET | Freq: Every day | ORAL | Status: DC

## 2023-12-03 MED ORDER — ACETAMINOPHEN 325 MG PO TABS
650.0000 mg | ORAL_TABLET | ORAL | Status: DC | PRN
  Administered 2023-12-03 – 2023-12-04 (×2): 650 mg via ORAL
  Filled 2023-12-03 (×2): qty 2

## 2023-12-03 MED ORDER — SENNOSIDES-DOCUSATE SODIUM 8.6-50 MG PO TABS: 1.0000 | ORAL_TABLET | Freq: Every evening | ORAL | Status: DC | PRN

## 2023-12-03 MED ORDER — CLOPIDOGREL BISULFATE 75 MG PO TABS
75.0000 mg | ORAL_TABLET | Freq: Every day | ORAL | Status: DC
  Administered 2023-12-03: 75 mg via ORAL
  Filled 2023-12-03: qty 1

## 2023-12-03 MED ORDER — ATORVASTATIN CALCIUM 80 MG PO TABS
80.0000 mg | ORAL_TABLET | Freq: Every day | ORAL | Status: DC
  Administered 2023-12-03 – 2023-12-04 (×2): 80 mg via ORAL
  Filled 2023-12-03: qty 2
  Filled 2023-12-03: qty 1

## 2023-12-03 MED ORDER — ENOXAPARIN SODIUM 40 MG/0.4ML IJ SOSY
40.0000 mg | PREFILLED_SYRINGE | INTRAMUSCULAR | Status: DC
Start: 2023-12-03 — End: 2023-12-03

## 2023-12-03 MED ORDER — INSULIN ASPART 100 UNIT/ML IJ SOLN: 0.0000 [IU] | Freq: Every day | INTRAMUSCULAR | Status: DC

## 2023-12-03 MED ORDER — STROKE: EARLY STAGES OF RECOVERY BOOK
Freq: Once | Status: AC
  Filled 2023-12-03: qty 1

## 2023-12-03 MED ORDER — INSULIN ASPART 100 UNIT/ML IJ SOLN
0.0000 [IU] | Freq: Three times a day (TID) | INTRAMUSCULAR | Status: DC
  Administered 2023-12-03 – 2023-12-04 (×3): 2 [IU] via SUBCUTANEOUS

## 2023-12-03 MED ORDER — SODIUM CHLORIDE 0.9% FLUSH: 3.0000 mL | INTRAVENOUS | Status: DC | PRN

## 2023-12-03 MED ORDER — HYDRALAZINE HCL 20 MG/ML IJ SOLN: 10.0000 mg | Freq: Four times a day (QID) | INTRAMUSCULAR | Status: DC | PRN

## 2023-12-03 MED ORDER — ASPIRIN 81 MG PO TBEC
81.0000 mg | DELAYED_RELEASE_TABLET | Freq: Every day | ORAL | Status: DC
  Administered 2023-12-03: 81 mg via ORAL
  Filled 2023-12-03: qty 1

## 2023-12-03 MED ORDER — CLOPIDOGREL BISULFATE 75 MG PO TABS
75.0000 mg | ORAL_TABLET | Freq: Every day | ORAL | Status: DC
Start: 2023-12-03 — End: 2023-12-04
  Administered 2023-12-03 – 2023-12-04 (×2): 75 mg via ORAL
  Filled 2023-12-03 (×2): qty 1

## 2023-12-03 MED ORDER — CLOPIDOGREL BISULFATE 75 MG PO TABS: 75.0000 mg | ORAL_TABLET | Freq: Every day | ORAL | Status: DC

## 2023-12-03 MED ORDER — INSULIN ASPART 100 UNIT/ML IJ SOLN: 0.0000 [IU] | Freq: Three times a day (TID) | INTRAMUSCULAR | Status: DC

## 2023-12-03 MED ORDER — LACTATED RINGERS IV SOLN: INTRAVENOUS | Status: AC

## 2023-12-03 MED ORDER — ACETAMINOPHEN 500 MG PO TABS
1000.0000 mg | ORAL_TABLET | Freq: Once | ORAL | Status: AC
  Administered 2023-12-03: 1000 mg via ORAL
  Filled 2023-12-03: qty 2

## 2023-12-03 MED ORDER — ACETAMINOPHEN 650 MG RE SUPP: 650.0000 mg | RECTAL | Status: DC | PRN

## 2023-12-03 NOTE — Progress Notes (Addendum)
 STROKE TEAM PROGRESS NOTE   INTERIM HISTORY/SUBJECTIVE Patient presented with transient left-sided numbness weakness and ataxia.  MRI is negative for acute stroke but shows remote age to small right pontine microhemorrhages likely from cavernoma and extensive changes of chronic small vessel disease and microhemorrhages.  CT angiogram shows no large vessel stenosis or occlusion. Patient remains afebrile and hemodynamically stable.  He reports near complete resolution of his symptoms.  OBJECTIVE  CBC    Component Value Date/Time   WBC 6.4 12/03/2023 0355   RBC 5.05 12/03/2023 0355   HGB 14.4 12/03/2023 0355   HCT 43.8 12/03/2023 0355   PLT 182 12/03/2023 0355   MCV 86.7 12/03/2023 0355   MCH 28.5 12/03/2023 0355   MCHC 32.9 12/03/2023 0355   RDW 14.2 12/03/2023 0355   LYMPHSABS 2.1 12/02/2023 2344   MONOABS 0.5 12/02/2023 2344   EOSABS 0.2 12/02/2023 2344   BASOSABS 0.1 12/02/2023 2344    BMET    Component Value Date/Time   NA 136 12/03/2023 0355   K 3.5 12/03/2023 0355   CL 101 12/03/2023 0355   CO2 24 12/03/2023 0355   GLUCOSE 145 (H) 12/03/2023 0355   BUN 10 12/03/2023 0355   CREATININE 1.27 (H) 12/03/2023 0355   CALCIUM  8.8 (L) 12/03/2023 0355   GFRNONAA >60 12/03/2023 0355    IMAGING past 24 hours MR BRAIN WO CONTRAST Result Date: 12/03/2023 CLINICAL DATA:  Neuro deficit with stroke suspected EXAM: MRI HEAD WITHOUT CONTRAST TECHNIQUE: Multiplanar, multiecho pulse sequences of the brain and surrounding structures were obtained without intravenous contrast. COMPARISON:  Head CTA from earlier today FINDINGS: Brain: No acute infarction, hemorrhage, hydrocephalus, or collection. Rounded lesion in the right pons with hemosiderin rim and stippled bright areas by T1 and T2 weighted imaging, up to 9 mm and consistent with cavernoma. Similar features in the more superior right pons with adjacent/bilobed appearance, in total measuring up to 9 mm. Extensive chronic small vessel  disease with confluent gliosis in the deep white matter and indistinct appearance in the pons. 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 Brain volume is normal. There are few scattered chronic microhemorrhages separate from the chronic blood products at the pons. Vascular: Major flow voids are preserved. Skull and upper cervical spine: Cervical spine degeneration with C3-4 mild anterolisthesis. Sinuses/Orbits: No acute finding IMPRESSION: No acute finding including infarct. Advanced chronic small vessel disease. 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 No indication of recent hemorrhage. Consider imaging follow-up for stability. Electronically Signed   By: Ronnette Coke M.D.   On: 12/03/2023 05:15   CT ANGIO HEAD NECK W WO CM Result Date: 12/03/2023 CLINICAL DATA:  Acute neurologic deficit. Left upper and lower extremity weakness EXAM: CT ANGIOGRAPHY HEAD AND NECK WITH AND WITHOUT CONTRAST TECHNIQUE: Multidetector CT imaging of the head and neck was performed using the standard protocol during bolus administration of intravenous contrast. Multiplanar CT image reconstructions and MIPs were obtained to evaluate the vascular anatomy. Carotid stenosis measurements (when applicable) are obtained utilizing NASCET criteria, using the distal internal carotid diameter as the denominator. RADIATION DOSE REDUCTION: This exam was performed according to the departmental dose-optimization program which includes automated exposure control, adjustment of the mA and/or kV according to patient size and/or use of iterative reconstruction technique. CONTRAST:  75mL OMNIPAQUE  IOHEXOL  350 MG/ML SOLN COMPARISON:  None Available. FINDINGS: CT HEAD FINDINGS Brain: There is no mass, hemorrhage or extra-axial collection. The size and  configuration of the ventricles and extra-axial CSF spaces are normal. There is hypoattenuation of the white matter, most  commonly indicating chronic small vessel disease. Vascular: No hyperdense vessel or unexpected vascular calcification. Skull: The visualized skull base, calvarium and extracranial soft tissues are normal. Sinuses/Orbits: No fluid levels or advanced mucosal thickening of the visualized paranasal sinuses. No mastoid or middle ear effusion. Normal orbits. CTA NECK FINDINGS Skeleton: No acute abnormality or high grade bony spinal canal stenosis. Other neck: Normal pharynx, larynx and major salivary glands. No cervical lymphadenopathy. Unremarkable thyroid gland. Upper chest: No pneumothorax or pleural effusion. No nodules or masses. Aortic arch: There is calcific atherosclerosis of the aortic arch. Conventional 3 vessel aortic branching pattern. RIGHT carotid system: Normal without aneurysm, dissection or stenosis. LEFT carotid system: Normal without aneurysm, dissection or stenosis. Vertebral arteries: Left dominant configuration. There is no dissection, occlusion or flow-limiting stenosis to the skull base (V1-V3 segments). CTA HEAD FINDINGS POSTERIOR CIRCULATION: Moderate stenosis of the distal left V4 segment. No proximal occlusion of the anterior or inferior cerebellar arteries. Basilar artery is normal. Superior cerebellar arteries are normal. Posterior cerebral arteries are normal. ANTERIOR CIRCULATION: Intracranial internal carotid arteries are normal. Anterior cerebral arteries are normal. Middle cerebral arteries are normal. Venous sinuses: As permitted by contrast timing, patent. Anatomic variants: None Review of the MIP images confirms the above findings. IMPRESSION: 1. No emergent large vessel occlusion or high-grade stenosis of the intracranial arteries. 2. Chronic small vessel disease. Aortic Atherosclerosis (ICD10-I70.0). Electronically Signed   By: Juanetta Nordmann M.D.   On: 12/03/2023 01:51    Vitals:   12/03/23 0500 12/03/23 0530 12/03/23 0830 12/03/23 0900  BP: (!) 155/84 (!) 143/70 (!) 160/91 (!)  167/85  Pulse: (!) 53 (!) 49 (!) 57 (!) 52  Resp: 14 15 13 18   Temp:    98.1 F (36.7 C)  TempSrc:      SpO2: 97% 98% 96% 96%  Weight:      Height:         PHYSICAL EXAM General:  Alert, well-nourished, well-developed patient in no acute distress Psych:  Mood and affect appropriate for situation CV: Regular rate and rhythm on monitor Respiratory:  Regular, unlabored respirations on room air   NEURO:  Mental Status: AA&Ox3, patient is able to give clear and coherent history Speech/Language: speech is without dysarthria or aphasia.    Cranial Nerves:  II: PERRL. Visual fields full.  III, IV, VI: EOMI. Eyelids elevate symmetrically.  V: Sensation is intact to light touch and slightly rougher on the left VII: Face is symmetrical resting and smiling VIII: hearing intact to voice. IX, X: Phonation is normal.  [id number] shrug 5/5. XII: tongue is midline without fasciculations. Motor: 5/5 strength to all muscle groups tested.  Tone: is normal and bulk is normal Sensation- Intact to light touch bilaterally but diminished at right foot (patient is postsurgery) Coordination: FTN intact bilaterally Gait- deferred  Most Recent NIH  1a Level of Conscious.: 0 1b LOC Questions: 0 1c LOC Commands: 0 2 Best Gaze: 0 3 Visual: 0 4 Facial Palsy: 0 5a Motor Arm - left: 0 5b Motor Arm - Right: 0 6a Motor Leg - Left: 0 6b Motor Leg - Right: 0 7 Limb Ataxia: 0 8 Sensory: 1 9 Best Language: 0 10 Dysarthria: 0 11 Extinct. and Inatten.: 0 TOTAL: 1   ASSESSMENT/PLAN  Mr. Jason Meadows is a 63 y.o. male with history of hypertension, hyperlipidemia, smoking and diabetes admitted for sudden  onset left-sided weakness.  Patient states he was doing some yard work and suddenly felt weakness of the left leg with disequilibrium and imbalance and numbness of the left arm and leg.  MRI was negative for acute infarct, and symptoms have largely resolved.  He has 2 cavernoma's noted in the right  pons on MRI and has been evaluated by neurosurgery.  He will follow-up with them as an outpatient for this.  NIH on Admission 1  Right-brain TIA likely from small vessel disease.  Incidental pontine cavernoma's. CT head No acute abnormality. Small vessel disease.  CTA head & neck no LVO or hemodynamically significant stenosis MRI no acute abnormality, chronic small vessel ischemic disease, 2 lesions in the right pons consistent with cavernoma 2D Echo pending LDL 166 HgbA1c pending VTE prophylaxis -SCDs No antithrombotic prior to admission, now on aspirin  81 mg daily and clopidogrel  75 mg daily for 3 weeks and then aspirin  alone. 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 Cortney E Bucky Cardinal , MSN, AGACNP-BC [facility] See Amion for schedule and pager information 12/03/2023 1:15 PM  I have personally obtained history,examined this patient, reviewed notes, independently viewed imaging studies,  participated in medical decision making and plan of care.ROS completed by me personally and pertinent positives fully documented  I have made any additions or clarifications directly to the above note. Agree with note above.  Patient presented with transient left-sided weakness numbness and ataxia likely from TIA from small vessel disease.  MRI is negative for acute stroke but shows remote age pontine cavernoma as well as extensive changes of small vessel disease and microhemorrhages.  Recommend aspirin  and Plavix  for 3 weeks followed by aspirin  alone and aggressive risk factor modification.  Continue ongoing stroke workup.  Greater than 50% time during this 50-minute visit spent in counseling and coordination of care and discussion patient care team and answering questions.  Ardella Beaver, MD Medical Director [facility] Pager: [phone] 12/03/2023 3:11 PM   To contact Stroke Continuity provider, please refer to [email or web address]. After hours, contact General Neurology
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
## 2023-12-03 NOTE — Care Management Obs Status (Signed)
 MEDICARE OBSERVATION STATUS NOTIFICATION   Patient Details  Name: Jason Meadows MRN: [id number] Date of Birth: 1961/07/22   Medicare Observation Status Notification Given:  Yes    Shadawn Hanaway, RN 12/03/2023, 4:26 PM

## 2023-12-03 NOTE — Consult Note (Addendum)
 Providing Compassionate, Quality Care - Together   Reason for Consult: Pons lesions Referring Physician: Dr. Lander Pines Jason Meadows is an 63 y.o. male.  HPI: Jason Meadows is a pleasant 63 year old male with a past medical history significant for diabetes mellitus, high cholesterol, and hypertension. He presented to the [facility] Emergency Department with LUE and LLE numbness and weakness that started around 4 PM yesterday. His symptoms had improved when he presented to the ED. CT head and brain MRI performed in the ED did not show an acute stroke. MRI did show a rounded lesion in the right pons with hemosiderin rim and stippled bright areas by T1 and T2 weighted imaging, up to 9 mm and consistent with cavernoma. There was a similar lesion in the more superior right pons with adjacent/bilobed appearance measuring up to 9 mm. Neurosurgery was consulted for further evaluation and recommendations.  Past Medical History:  Diagnosis Date   Diabetes mellitus without complication (HCC)    High cholesterol    Hypertension     Past Surgical History:  Procedure Laterality Date   BACK SURGERY     EXTERNAL EAR SURGERY     KNEE SURGERY      History reviewed. No pertinent family history.  Social History:  reports that he has been smoking cigarettes. He has never used smokeless tobacco. He reports that he does not drink alcohol and does not use drugs.  Allergies: No Known Allergies  Medications: I have reviewed the patient's current medications.  Results for orders placed or performed during the hospital encounter of 12/02/23 (from the past 48 hours)  Ethanol     Status: None   Collection Time: 12/02/23 11:44 PM  Result Value Ref Range   Alcohol, Ethyl (B) <15 <15 mg/dL    Comment: Please note change in reference range. (NOTE) For medical purposes only. Performed at [facility], [address], [place], [place] [address]   Protime-INR     Status: None   Collection Time:  12/02/23 11:44 PM  Result Value Ref Range   Prothrombin Time 14.7 11.4 - 15.2 seconds   INR 1.1 0.8 - 1.2    Comment: (NOTE) INR goal varies based on device and disease states. Performed at [facility], [address], [place], [place] [address]   APTT     Status: None   Collection Time: 12/02/23 11:44 PM  Result Value Ref Range   aPTT 28 24 - 36 seconds    Comment: Performed at [facility], [address] [place], [place] [address]  CBC     Status: None   Collection Time: 12/02/23 11:44 PM  Result Value Ref Range   WBC 6.9 4.0 - 10.5 K/uL   RBC 5.12 4.22 - 5.81 MIL/uL   Hemoglobin 14.9 13.0 - 17.0 g/dL   HCT 03.4 74.2 - 59.5 %   MCV 86.7 80.0 - 100.0 fL   MCH 29.1 26.0 - 34.0 pg   MCHC 33.6 30.0 - 36.0 g/dL   RDW 63.8 75.6 - 43.3 %   Platelets 200 150 - 400 K/uL   nRBC 0.0 0.0 - 0.2 %    Comment: Performed at [facility], [address] [place], [place] [address]  Differential     Status: None   Collection Time: 12/02/23 11:44 PM  Result Value Ref Range   Neutrophils Relative % 58 %   Neutro Abs 4.0 1.7 - 7.7 K/uL  Lymphocytes Relative 31 %   Lymphs Abs 2.1 0.7 - 4.0 K/uL   Monocytes Relative 7 %   Monocytes Absolute 0.5 0.1 - 1.0 K/uL   Eosinophils Relative 3 %   Eosinophils Absolute 0.2 0.0 - 0.5 K/uL   Basophils Relative 1 %   Basophils Absolute 0.1 0.0 - 0.1 K/uL   Immature Granulocytes 0 %   Abs Immature Granulocytes 0.01 0.00 - 0.07 K/uL    Comment: Performed at [facility], [address] [place], [place] [address]  Comprehensive metabolic panel     Status: Abnormal   Collection Time: 12/02/23 11:44 PM  Result Value Ref Range   Sodium 138 135 - 145 mmol/L   Potassium 3.5 3.5 - 5.1 mmol/L   Chloride 102 98 - 111 mmol/L   CO2 26 22 - 32 mmol/L   Glucose, Bld 180 (H) 70 - 99 mg/dL    Comment: Glucose reference range applies only to samples taken after fasting for at least 8 hours.   BUN 9 8 - 23 mg/dL   Creatinine, Ser 1.91  (H) 0.61 - 1.24 mg/dL   Calcium  9.0 8.9 - 10.3 mg/dL   Total Protein 6.7 6.5 - 8.1 g/dL   Albumin 3.7 3.5 - 5.0 g/dL   AST 24 15 - 41 U/L   ALT 20 0 - 44 U/L   Alkaline Phosphatase 82 38 - 126 U/L   Total Bilirubin 0.8 0.0 - 1.2 mg/dL   GFR, Estimated >47 >82 mL/min    Comment: (NOTE) Calculated using the CKD-EPI Creatinine Equation (2021)    Anion gap 10 5 - 15    Comment: Performed at [facility], [address], [place], [place] [address]  I-stat chem 8, ED     Status: Abnormal   Collection Time: 12/03/23 12:49 AM  Result Value Ref Range   Sodium 139 135 - 145 mmol/L   Potassium 4.6 3.5 - 5.1 mmol/L   Chloride 101 98 - 111 mmol/L   BUN 11 8 - 23 mg/dL   Creatinine, Ser 3.08 (H) 0.61 - 1.24 mg/dL   Glucose, Bld 657 (H) 70 - 99 mg/dL    Comment: Glucose reference range applies only to samples taken after fasting for at least 8 hours.   Calcium , Ion 1.08 (L) 1.15 - 1.40 mmol/L   TCO2 29 22 - 32 mmol/L   Hemoglobin 15.6 13.0 - 17.0 g/dL   HCT 84.6 96.2 - 95.2 %  Lipid panel     Status: Abnormal   Collection Time: 12/03/23  3:55 AM  Result Value Ref Range   Cholesterol 209 (H) 0 - 200 mg/dL   Triglycerides 841 <324 mg/dL   HDL 18 (L) >40 mg/dL   Total CHOL/HDL Ratio 11.6 RATIO   VLDL 25 0 - 40 mg/dL   LDL Cholesterol 102 (H) 0 - 99 mg/dL    Comment:        Total Cholesterol/HDL:CHD Risk Coronary Heart Disease Risk Table                     Men   Women  1/2 Average Risk   3.4   3.3  Average Risk       5.0   4.4  2 X Average Risk   9.6   7.1  3 X Average Risk  23.4   11.0        Use the calculated Patient Ratio above and the CHD Risk Table to  determine the patient's CHD Risk.        ATP III CLASSIFICATION (LDL):  <100     mg/dL   Optimal  161-096  mg/dL   Near or Above                    Optimal  130-159  mg/dL   Borderline  045-409  mg/dL   High  >811     mg/dL   Very High Performed at [facility], [address], [place], [place] [address]   CBC      Status: None   Collection Time: 12/03/23  3:55 AM  Result Value Ref Range   WBC 6.4 4.0 - 10.5 K/uL   RBC 5.05 4.22 - 5.81 MIL/uL   Hemoglobin 14.4 13.0 - 17.0 g/dL   HCT 29.5 62.1 - 30.8 %   MCV 86.7 80.0 - 100.0 fL   MCH 28.5 26.0 - 34.0 pg   MCHC 32.9 30.0 - 36.0 g/dL   RDW 65.7 84.6 - 96.2 %   Platelets 182 150 - 400 K/uL   nRBC 0.0 0.0 - 0.2 %    Comment: Performed at [facility], [address] [place], [place] [address]  Basic metabolic panel     Status: Abnormal   Collection Time: 12/03/23  3:55 AM  Result Value Ref Range   Sodium 136 135 - 145 mmol/L   Potassium 3.5 3.5 - 5.1 mmol/L   Chloride 101 98 - 111 mmol/L   CO2 24 22 - 32 mmol/L   Glucose, Bld 145 (H) 70 - 99 mg/dL    Comment: Glucose reference range applies only to samples taken after fasting for at least 8 hours.   BUN 10 8 - 23 mg/dL   Creatinine, Ser 1.32 (H) 0.61 - 1.24 mg/dL   Calcium  8.8 (L) 8.9 - 10.3 mg/dL   GFR, Estimated >44 >01 mL/min    Comment: (NOTE) Calculated using the CKD-EPI Creatinine Equation (2021)    Anion gap 11 5 - 15    Comment: Performed at [facility], [address], [place], [place] [address]  Urinalysis, Routine w reflex microscopic -Urine, Clean Catch     Status: Abnormal   Collection Time: 12/03/23  7:30 AM  Result Value Ref Range   Color, Urine YELLOW YELLOW   APPearance CLEAR CLEAR   Specific Gravity, Urine >1.046 (H) 1.005 - 1.030   pH 5.0 5.0 - 8.0   Glucose, UA >=500 (A) NEGATIVE mg/dL   Hgb urine dipstick NEGATIVE NEGATIVE   Bilirubin Urine NEGATIVE NEGATIVE   Ketones, ur NEGATIVE NEGATIVE mg/dL   Protein, ur NEGATIVE NEGATIVE mg/dL   Nitrite NEGATIVE NEGATIVE   Leukocytes,Ua NEGATIVE NEGATIVE   RBC / HPF 0-5 0 - 5 RBC/hpf   WBC, UA 0-5 0 - 5 WBC/hpf   Bacteria, UA NONE SEEN NONE SEEN   Squamous Epithelial / HPF 0-5 0 - 5 /HPF   Mucus PRESENT     Comment: Performed at [facility], [address] [place], [place] [address]  CBG  monitoring, ED     Status: Abnormal   Collection Time: 12/03/23  8:04 AM  Result Value Ref Range   Glucose-Capillary 118 (H) 70 - 99 mg/dL    Comment: Glucose reference range applies only to samples taken after fasting for at least 8 hours.    MR BRAIN WO CONTRAST Result Date: 12/03/2023 CLINICAL DATA:  Neuro deficit with stroke suspected  EXAM: MRI HEAD WITHOUT CONTRAST TECHNIQUE: Multiplanar, multiecho pulse sequences of the brain and surrounding structures were obtained without intravenous contrast. COMPARISON:  Head CTA from earlier today FINDINGS: Brain: No acute infarction, hemorrhage, hydrocephalus, or collection. Rounded lesion in the right pons with hemosiderin rim and stippled bright areas by T1 and T2 weighted imaging, up to 9 mm and consistent with cavernoma. Similar features in the more superior right pons with adjacent/bilobed appearance, in total measuring up to 9 mm. Extensive chronic small vessel disease with confluent gliosis in the deep white matter and indistinct appearance in the pons. 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 Brain volume is normal. There are few scattered chronic microhemorrhages separate from the chronic blood products at the pons. Vascular: Major flow voids are preserved. Skull and upper cervical spine: Cervical spine degeneration with C3-4 mild anterolisthesis. Sinuses/Orbits: No acute finding IMPRESSION: No acute finding including infarct. Advanced chronic small vessel disease. 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 No indication of recent hemorrhage. Consider imaging follow-up for stability. Electronically Signed   By: Ronnette Coke M.D.   On: 12/03/2023 05:15   CT ANGIO HEAD NECK W WO CM Result Date: 12/03/2023 CLINICAL DATA:  Acute neurologic deficit. Left upper and lower extremity weakness EXAM: CT ANGIOGRAPHY HEAD AND NECK WITH AND WITHOUT CONTRAST TECHNIQUE: Multidetector CT  imaging of the head and neck was performed using the standard protocol during bolus administration of intravenous contrast. Multiplanar CT image reconstructions and MIPs were obtained to evaluate the vascular anatomy. Carotid stenosis measurements (when applicable) are obtained utilizing NASCET criteria, using the distal internal carotid diameter as the denominator. RADIATION DOSE REDUCTION: This exam was performed according to the departmental dose-optimization program which includes automated exposure control, adjustment of the mA and/or kV according to patient size and/or use of iterative reconstruction technique. CONTRAST:  75mL OMNIPAQUE  IOHEXOL  350 MG/ML SOLN COMPARISON:  None Available. FINDINGS: CT HEAD FINDINGS Brain: There is no mass, hemorrhage or extra-axial collection. The size and configuration of the ventricles and extra-axial CSF spaces are normal. There is hypoattenuation of the white matter, most commonly indicating chronic small vessel disease. Vascular: No hyperdense vessel or unexpected vascular calcification. Skull: The visualized skull base, calvarium and extracranial soft tissues are normal. Sinuses/Orbits: No fluid levels or advanced mucosal thickening of the visualized paranasal sinuses. No mastoid or middle ear effusion. Normal orbits. CTA NECK FINDINGS Skeleton: No acute abnormality or high grade bony spinal canal stenosis. Other neck: Normal pharynx, larynx and major salivary glands. No cervical lymphadenopathy. Unremarkable thyroid gland. Upper chest: No pneumothorax or pleural effusion. No nodules or masses. Aortic arch: There is calcific atherosclerosis of the aortic arch. Conventional 3 vessel aortic branching pattern. RIGHT carotid system: Normal without aneurysm, dissection or stenosis. LEFT carotid system: Normal without aneurysm, dissection or stenosis. Vertebral arteries: Left dominant configuration. There is no dissection, occlusion or flow-limiting stenosis to the skull base  (V1-V3 segments). CTA HEAD FINDINGS POSTERIOR CIRCULATION: Moderate stenosis of the distal left V4 segment. No proximal occlusion of the anterior or inferior cerebellar arteries. Basilar artery is normal. Superior cerebellar arteries are normal. Posterior cerebral arteries are normal. ANTERIOR CIRCULATION: Intracranial internal carotid arteries are normal. Anterior cerebral arteries are normal. Middle cerebral arteries are normal. Venous sinuses: As permitted by contrast timing, patent. Anatomic variants: None Review of the MIP images confirms the above findings. IMPRESSION: 1. No emergent large vessel occlusion or high-grade stenosis of the intracranial arteries.  2. Chronic small vessel disease. Aortic Atherosclerosis (ICD10-I70.0). Electronically Signed   By: Juanetta Nordmann M.D.   On: 12/03/2023 01:51    Review of Systems  Constitutional: Negative.   HENT: Negative.    Eyes: Negative.   Respiratory: Negative.    Cardiovascular: Negative.   Gastrointestinal: Negative.   Genitourinary: Negative.   Musculoskeletal: Negative.   Skin: Negative.   Neurological:  Positive for sensory change and focal weakness.  Endo/Heme/Allergies: Negative.   Psychiatric/Behavioral: Negative.     Blood pressure (!) 143/70, pulse (!) 49, temperature 97.9 F (36.6 C), temperature source Oral, resp. rate 15, height 5\' 11"  (1.803 m), weight 110.2 kg, SpO2 98%. Estimated body mass index is 33.89 kg/m as calculated from the following:   Height as of this encounter: 5\' 11"  (1.803 m).   Weight as of this encounter: 110.2 kg.  Physical Exam Constitutional:      Appearance: Normal appearance.  HENT:     Head: Normocephalic and atraumatic.     Nose: Nose normal.     Mouth/Throat:     Mouth: Mucous membranes are moist.     Pharynx: Oropharynx is clear.  Eyes:     Extraocular Movements: Extraocular movements intact.     Pupils: Pupils are equal, round, and reactive to light.  Cardiovascular:     Rate and Rhythm:  Regular rhythm. Bradycardia present.     Pulses: Normal pulses.  Pulmonary:     Effort: Pulmonary effort is normal. No respiratory distress.  Abdominal:     Palpations: Abdomen is soft.  Musculoskeletal:        General: Normal range of motion.     Cervical back: Normal range of motion and neck supple.  Skin:    General: Skin is warm and dry.     Capillary Refill: Capillary refill takes less than 2 seconds.  Neurological:     Mental Status: He is alert and oriented to person, place, and time.     Cranial Nerves: Cranial nerves 2-12 are intact.     Motor: Pronator drift present.     Coordination: Finger-Nose-Finger Test and Heel to [employer] normal.     Comments: LLE with slight drift  Psychiatric:        Mood and Affect: Mood normal.        Behavior: Behavior normal.        Thought Content: Thought content normal.        Judgment: Judgment normal.     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 Recommend outpatient follow up with Dr. Larrie Po in one month. No surgical intervention recommended. Patient is ok to stay on the aspirin  and Plavix  for 21 days.  I am in communication with my attending and they agree with the plan for this patient.   Henreitta Locus, DNP, AGNP-C Nurse Practitioner  [facility] [address], [place], [place] [address] P: [phone]    F: [phone]  12/03/2023, 8:38 AM
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
## 2023-12-03 NOTE — Consult Note (Signed)
 NEUROLOGY CONSULT NOTE   Date of service: December 03, 2023 Patient Name: Jason Meadows MRN:  [id number] DOB:  [birth year] Chief Complaint: "L sided weakness" Requesting Provider: Lindle Rhea, *  History of Present Illness  Jason Meadows is a 63 y.o. male with hx of DM2, HTN, HLD, smoker, who presents with sudden onset left-sided weakness.  Patient reports that he was mowing his lawn in the afternoon when he had sudden onset left leg weakness around 1600.  He felt like he was falling to his left side.  He denies any dizziness but does endorse disequilibrium and feeling off balance.  He reports that he felt that his left leg and left arm felt likely numb.  He waited on his symptoms to improve and discussed with his wife later on.  Wife called EMS and patient was brought into the ED for further evaluation workup.  Patient denies any history of strokes, he endorses family history of strokes.  He endorses history of borderline diabetes, hypertension, hyperlipidemia.  He goes through a pack of cigarettes in a day and a half.  He does not drink alcohol.  He does not use any recreational substances.  He denies any history of atrial fibrillation.  LKW: 1600 on 12/02/2023. Modified rankin score: 0-Completely asymptomatic and back to baseline post- stroke IV Thrombolysis: Not offered, too mild to treat and patient is outside the window.   EVT: Not offered, too mild to treat and no LVO.    NIHSS components Score: Comment  1a Level of Conscious 0[x]  1[]  2[]  3[]      1b LOC Questions 0[x]  1[]  2[]       1c LOC Commands 0[x]  1[]  2[]       2 Best Gaze 0[x]  1[]  2[]       3 Visual 0[x]  1[]  2[]  3[]      4 Facial Palsy 0[x]  1[]  2[]  3[]      5a Motor Arm - left 0[x]  1[]  2[]  3[]  4[]  UN[]    5b Motor Arm - Right 0[x]  1[]  2[]  3[]  4[]  UN[]    6a Motor Leg - Left 0[x]  1[]  2[]  3[]  4[]  UN[]    6b Motor Leg - Right 0[x]  1[]  2[]  3[]  4[]  UN[]    7 Limb Ataxia 0[x]  1[]  2[]  3[]  UN[]     8 Sensory 0[]  1[x]  2[]  UN[]       9 Best Language 0[x]  1[]  2[]  3[]      10 Dysarthria 0[x]  1[]  2[]  UN[]      11 Extinct. and Inattention 0[x]  1[]  2[]       TOTAL: 1      ROS  Comprehensive ROS performed and pertinent positives documented in HPI   Past History   Past Medical History:  Diagnosis Date   Diabetes mellitus without complication (HCC)    High cholesterol    Hypertension     Past Surgical History:  Procedure Laterality Date   BACK SURGERY     EXTERNAL EAR SURGERY     KNEE SURGERY      Family History: History reviewed. No pertinent family history.  Social History  reports that he has been smoking cigarettes. He has never used smokeless tobacco. He reports that he does not drink alcohol and does not use drugs.  No Known Allergies  Medications  No current facility-administered medications for this encounter.  Current Outpatient Medications:    atorvastatin  (LIPITOR) 40 MG tablet, Take 40 mg by mouth daily. (Patient not taking: Reported on 12/03/2023), Disp: , Rfl:    lisinopril-hydrochlorothiazide (ZESTORETIC) 10-12.5 MG tablet,  Take 1 tablet by mouth daily. (Patient not taking: Reported on 12/03/2023), Disp: , Rfl:    metFORMIN  (GLUCOPHAGE ) 500 MG tablet, Take 500 mg by mouth daily with breakfast. (Patient not taking: Reported on 12/03/2023), Disp: , Rfl:   Vitals   Vitals:   12-28-23 2316 28-Dec-2023 2345 12/03/23 0000 12/03/23 0100  BP: (!) 160/83 (!) 141/84 (!) 183/87 126/67  Pulse: 63 60 65 (!) 57  Resp: (!) 22 (!) 22 16 17   Temp: 97.9 F (36.6 C)     TempSrc: Oral     SpO2: 100% 99% 100% 100%  Weight: 110.2 kg     Height: 5\' 11"  (1.803 m)       Body mass index is 33.89 kg/m.  Physical Exam   General: Laying comfortably in bed; in no acute distress.  HENT: Normal oropharynx and mucosa. Normal external appearance of ears and nose.  Neck: Supple, no pain or tenderness  CV: No JVD. No peripheral edema.  Pulmonary: Symmetric Chest rise. Normal respiratory effort.  Abdomen: Soft to  touch, non-tender.  Ext: No cyanosis, edema, or deformity  Skin: No rash. Normal palpation of skin.   Musculoskeletal: Normal digits and nails by inspection. No clubbing.   Neurologic Examination  Mental status/Cognition: Alert, oriented to self, place, month and year, good attention.  Speech/language: Fluent, comprehension intact, object naming intact, repetition intact.  Cranial nerves:   CN II Pupils equal and reactive to light, no VF deficits    CN III,IV,VI EOM intact, no gaze preference or deviation, no nystagmus    CN V normal sensation in V1, V2, and V3 segments bilaterally    CN VII no asymmetry, no nasolabial fold flattening    CN VIII normal hearing to speech    CN IX & X normal palatal elevation, no uvular deviation    CN XI 5/5 head turn and 5/5 shoulder shrug bilaterally    CN XII midline tongue protrusion    Motor:  Muscle bulk: Normal, tone normal, pronator drift none, tremor none. Mvmt Root Nerve  Muscle Right Left Comments  SA C5/6 Ax Deltoid 5 5   EF C5/6 Mc Biceps 5 5   EE C6/7/8 Rad Triceps 5 5   WF C6/7 Med FCR     WE C7/8 PIN ECU     F Ab C8/T1 U ADM/FDI 5 5   HF L1/2/3 Fem Illopsoas 5 4+   KE L2/3/4 Fem Quad 5 5   DF L4/5 D Peron Tib Ant 5 5   PF S1/2 Tibial Grc/Sol 5 5    Sensation:  Light touch Slightly decreased in left leg to touch.   Pin prick    Temperature    Vibration   Proprioception    Coordination/Complex Motor:  - Finger to Nose intact bilaterally - Heel to shin intact bilaterally - Rapid alternating movement are slowed in left leg. - Gait: Deferred. Labs/Imaging/Neurodiagnostic studies   CBC:  Recent Labs  Lab Dec 28, 2023 2344 12/03/23 0049  WBC 6.9  --   NEUTROABS 4.0  --   HGB 14.9 15.6  HCT 44.4 46.0  MCV 86.7  --   PLT 200  --    Basic Metabolic Panel:  Lab Results  Component Value Date   NA 139 12/03/2023   K 4.6 12/03/2023   CO2 26 2023/12/28   GLUCOSE 176 (H) 12/03/2023   BUN 11 12/03/2023   CREATININE 1.40 (H)  12/03/2023   CALCIUM  9.0 2023/12/28   GFRNONAA >60 12/28/23  GFRAA >60 12/15/2019   Lipid Panel: No results found for: "LDLCALC" HgbA1c: No results found for: "HGBA1C" Urine Drug Screen: No results found for: "LABOPIA", "COCAINSCRNUR", "LABBENZ", "AMPHETMU", "THCU", "LABBARB"  Alcohol Level     Component Value Date/Time   ETH <15 12/02/2023 2344   INR  Lab Results  Component Value Date   INR 1.1 12/02/2023   APTT  Lab Results  Component Value Date   APTT 28 12/02/2023   AED levels: No results found for: "PHENYTOIN", "ZONISAMIDE", "LAMOTRIGINE", "LEVETIRACETA"  CT Head without contrast(Personally reviewed): CTH was negative for a large hypodensity concerning for a large territory infarct or hyperdensity concerning for an ICH  CT angio Head and Neck with contrast(Personally reviewed): No LVO  MRI Brain(Personally reviewed): Pending.   ASSESSMENT   JALAN FARISS is a 63 y.o. male with hx of DM2, HTN, HLD, smoker, who presents with sudden onset left-sided weakness.  Exam with left hip flexion weakness and left leg numbness.  Presentation is very concerning for a small vessel stroke.  MRI of the brain is pending.  He does have a stroke risk factors including diabetes, hypertension, hyperlipidemia and current daily smoker.  RECOMMENDATIONS  - Frequent Neuro checks per stroke unit protocol - Recommend brain imaging with MRI Brain without contrast - Recommend obtaining TTE  - Recommend obtaining Lipid panel with LDL - Please start statin if LDL > 70 - Recommend HbA1c to evaluate for diabetes and how well it is controlled. - Antithrombotic -aspirin  81 mg daily along with Plavix  75 mg daily for 21 days, followed by aspirin  81 mg daily alone. - Recommend DVT ppx - SBP goal - permissive hypertension first 24 h < 220/110. Held home meds.  - Recommend Telemetry monitoring for arrythmia - Recommend bedside swallow screen prior to PO intake. - Stroke education booklet -  Recommend PT/OT/SLP consult - I counseled him extensively on the importance of quitting smoking to reduce risk of stroke in the future. ______________________________________________________________________  Plan discussed with patient, his wife at the bedside and then with Dr. Morris Arch with the ED team.  Signed, Kashayla Ungerer, MD [facility]

## 2023-12-04 ENCOUNTER — Other Ambulatory Visit (HOSPITAL_COMMUNITY): Payer: Self-pay

## 2023-12-04 ENCOUNTER — Observation Stay (HOSPITAL_BASED_OUTPATIENT_CLINIC_OR_DEPARTMENT_OTHER)

## 2023-12-04 DIAGNOSIS — I10 Essential (primary) hypertension: Secondary | ICD-10-CM | POA: Diagnosis not present

## 2023-12-04 DIAGNOSIS — I639 Cerebral infarction, unspecified: Secondary | ICD-10-CM

## 2023-12-04 DIAGNOSIS — R2990 Unspecified symptoms and signs involving the nervous system: Secondary | ICD-10-CM | POA: Diagnosis not present

## 2023-12-04 DIAGNOSIS — G459 Transient cerebral ischemic attack, unspecified: Secondary | ICD-10-CM

## 2023-12-04 DIAGNOSIS — E119 Type 2 diabetes mellitus without complications: Secondary | ICD-10-CM | POA: Diagnosis not present

## 2023-12-04 DIAGNOSIS — N179 Acute kidney failure, unspecified: Secondary | ICD-10-CM | POA: Diagnosis not present

## 2023-12-04 LAB — ECHOCARDIOGRAM COMPLETE
AR max vel: 2.36 cm2
AV Area VTI: 2.38 cm2
AV Area mean vel: 2.12 cm2
AV Mean grad: 4 mmHg
AV Peak grad: 7.6 mmHg
Ao pk vel: 1.38 m/s
Area-P 1/2: 3.42 cm2
Calc EF: 66.5 %
Height: 71 in
S' Lateral: 3.1 cm
Single Plane A2C EF: 67.5 %
Single Plane A4C EF: 64.4 %
Weight: 3888 [oz_av]

## 2023-12-04 LAB — HEMOGLOBIN A1C
Hgb A1c MFr Bld: 8.2 % — ABNORMAL HIGH (ref 4.8–5.6)
Mean Plasma Glucose: 188.64 mg/dL

## 2023-12-04 LAB — RAPID URINE DRUG SCREEN, HOSP PERFORMED
Amphetamines: NOT DETECTED
Barbiturates: NOT DETECTED
Benzodiazepines: NOT DETECTED
Cocaine: NOT DETECTED
Opiates: NOT DETECTED
Tetrahydrocannabinol: NOT DETECTED

## 2023-12-04 LAB — RENAL FUNCTION PANEL
Albumin: 3.5 g/dL (ref 3.5–5.0)
Anion gap: 9 (ref 5–15)
BUN: 11 mg/dL (ref 8–23)
CO2: 24 mmol/L (ref 22–32)
Calcium: 9 mg/dL (ref 8.9–10.3)
Chloride: 103 mmol/L (ref 98–111)
Creatinine, Ser: 1.19 mg/dL (ref 0.61–1.24)
GFR, Estimated: >60 mL/min (ref >60)
Glucose, Bld: 198 mg/dL — ABNORMAL HIGH (ref 70–99)
Phosphorus: 2.5 mg/dL (ref 2.5–4.6)
Potassium: 3.7 mmol/L (ref 3.5–5.1)
Sodium: 136 mmol/L (ref 135–145)

## 2023-12-04 LAB — GLUCOSE, CAPILLARY
Glucose-Capillary: 182 mg/dL — ABNORMAL HIGH (ref 70–99)
Glucose-Capillary: 157 mg/dL — ABNORMAL HIGH (ref 70–99)
Glucose-Capillary: 179 mg/dL — ABNORMAL HIGH (ref 70–99)

## 2023-12-04 MED ORDER — ASPIRIN 81 MG PO CHEW
81.0000 mg | CHEWABLE_TABLET | Freq: Every day | ORAL | 3 refills | Status: AC
  Filled 2023-12-04: qty 30, 30d supply, fill #0

## 2023-12-04 MED ORDER — AMLODIPINE BESYLATE 5 MG PO TABS
5.0000 mg | ORAL_TABLET | Freq: Every day | ORAL | 3 refills | Status: AC
  Filled 2023-12-04: qty 30, 30d supply, fill #0

## 2023-12-04 MED ORDER — ATORVASTATIN CALCIUM 80 MG PO TABS
80.0000 mg | ORAL_TABLET | Freq: Every day | ORAL | 3 refills | Status: AC
  Filled 2023-12-04: qty 30, 30d supply, fill #0

## 2023-12-04 MED ORDER — LISINOPRIL 20 MG PO TABS
20.0000 mg | ORAL_TABLET | Freq: Every day | ORAL | Status: DC
  Administered 2023-12-04: 20 mg via ORAL
  Filled 2023-12-04: qty 1

## 2023-12-04 MED ORDER — CLOPIDOGREL BISULFATE 75 MG PO TABS
75.0000 mg | ORAL_TABLET | Freq: Every day | ORAL | 0 refills | Status: DC
  Filled 2023-12-04: qty 21, 21d supply, fill #0

## 2023-12-04 MED ORDER — LISINOPRIL-HYDROCHLOROTHIAZIDE 10-12.5 MG PO TABS
1.0000 | ORAL_TABLET | Freq: Every day | ORAL | 3 refills | Status: DC
  Filled 2023-12-04: qty 30, 30d supply, fill #0

## 2023-12-04 MED ORDER — METFORMIN HCL 1000 MG PO TABS
1000.0000 mg | ORAL_TABLET | Freq: Two times a day (BID) | ORAL | 3 refills | Status: AC
  Filled 2023-12-04: qty 60, 30d supply, fill #0

## 2023-12-04 MED ORDER — AMLODIPINE BESYLATE 5 MG PO TABS
5.0000 mg | ORAL_TABLET | Freq: Every day | ORAL | Status: DC
  Administered 2023-12-04: 5 mg via ORAL
  Filled 2023-12-04: qty 1

## 2023-12-04 MED ORDER — NICOTINE 21 MG/24HR TD PT24
21.0000 mg | MEDICATED_PATCH | Freq: Every day | TRANSDERMAL | 0 refills | Status: AC
  Filled 2023-12-04: qty 28, 28d supply, fill #0
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
## 2023-12-04 NOTE — Plan of Care (Signed)
 Problem: Education: Goal: Ability to describe self-care measures that may prevent or decrease complications (Diabetes Survival Skills Education) will improve Outcome: Adequate for Discharge Goal: Individualized Educational Video(s) Outcome: Adequate for Discharge   Problem: Coping: Goal: Ability to adjust to condition or change in health will improve Outcome: Adequate for Discharge   Problem: Fluid Volume: Goal: Ability to maintain a balanced intake and output will improve Outcome: Adequate for Discharge   Problem: Health Behavior/Discharge Planning: Goal: Ability to identify and utilize available resources and services will improve Outcome: Adequate for Discharge Goal: Ability to manage health-related needs will improve Outcome: Adequate for Discharge   Problem: Metabolic: Goal: Ability to maintain appropriate glucose levels will improve Outcome: Adequate for Discharge   Problem: Nutritional: Goal: Maintenance of adequate nutrition will improve Outcome: Adequate for Discharge Goal: Progress toward achieving an optimal weight will improve Outcome: Adequate for Discharge   Problem: Skin Integrity: Goal: Risk for impaired skin integrity will decrease Outcome: Adequate for Discharge   Problem: Tissue Perfusion: Goal: Adequacy of tissue perfusion will improve Outcome: Adequate for Discharge   Problem: Education: Goal: Knowledge of disease or condition will improve Outcome: Adequate for Discharge Goal: Knowledge of secondary prevention will improve (MUST DOCUMENT ALL) Outcome: Adequate for Discharge Goal: Knowledge of patient specific risk factors will improve (DELETE if not current risk factor) Outcome: Adequate for Discharge   Problem: Ischemic Stroke/TIA Tissue Perfusion: Goal: Complications of ischemic stroke/TIA will be minimized Outcome: Adequate for Discharge   Problem: Coping: Goal: Will verbalize positive feelings about self Outcome: Adequate for  Discharge Goal: Will identify appropriate support needs Outcome: Adequate for Discharge   Problem: Health Behavior/Discharge Planning: Goal: Ability to manage health-related needs will improve Outcome: Adequate for Discharge Goal: Goals will be collaboratively established with patient/family Outcome: Adequate for Discharge   Problem: Self-Care: Goal: Ability to participate in self-care as condition permits will improve Outcome: Adequate for Discharge Goal: Verbalization of feelings and concerns over difficulty with self-care will improve Outcome: Adequate for Discharge Goal: Ability to communicate needs accurately will improve Outcome: Adequate for Discharge   Problem: Nutrition: Goal: Risk of aspiration will decrease Outcome: Adequate for Discharge Goal: Dietary intake will improve Outcome: Adequate for Discharge   Problem: Education: Goal: Knowledge of General Education information will improve Description: Including pain rating scale, medication(s)/side effects and non-pharmacologic comfort measures Outcome: Adequate for Discharge   Problem: Health Behavior/Discharge Planning: Goal: Ability to manage health-related needs will improve Outcome: Adequate for Discharge   Problem: Clinical Measurements: Goal: Ability to maintain clinical measurements within normal limits will improve Outcome: Adequate for Discharge Goal: Will remain free from infection Outcome: Adequate for Discharge Goal: Diagnostic test results will improve Outcome: Adequate for Discharge Goal: Respiratory complications will improve Outcome: Adequate for Discharge Goal: Cardiovascular complication will be avoided Outcome: Adequate for Discharge   Problem: Activity: Goal: Risk for activity intolerance will decrease Outcome: Adequate for Discharge   Problem: Nutrition: Goal: Adequate nutrition will be maintained Outcome: Adequate for Discharge   Problem: Coping: Goal: Level of anxiety will  decrease Outcome: Adequate for Discharge   Problem: Elimination: Goal: Will not experience complications related to bowel motility Outcome: Adequate for Discharge Goal: Will not experience complications related to urinary retention Outcome: Adequate for Discharge   Problem: Pain Managment: Goal: General experience of comfort will improve and/or be controlled Outcome: Adequate for Discharge   Problem: Safety: Goal: Ability to remain free from injury will improve Outcome: Adequate for Discharge   Problem: Skin Integrity: Goal: Risk for  impaired skin integrity will decrease Outcome: Adequate for Discharge

## 2023-12-04 NOTE — Progress Notes (Addendum)
 PT/OT recommending RW and a 3-in-1 BSC. Met with pt and wife. They agreed with DME. They report they don't have a preference for a DME agency. Will contact [employer] for referral and they agreed. Contacted Jermaine with [employer] and he accepted the referral.   13:05 - Referral received for outpt rehab. Referral sent to [facility] per pt and wife request.
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
## 2023-12-04 NOTE — Progress Notes (Signed)
 STROKE TEAM PROGRESS NOTE   INTERIM HISTORY/SUBJECTIVE Patient wife and RN are at the bedside. Pt stated that he is back to baseline now except some wobbly walking to bathroom. MRI negative, but he has multiple stroke risk factors.  Aggressive risk factor modification education provided.  OBJECTIVE  CBC    Component Value Date/Time   WBC 6.4 12/03/2023 0355   RBC 5.05 12/03/2023 0355   HGB 14.4 12/03/2023 0355   HCT 43.8 12/03/2023 0355   PLT 182 12/03/2023 0355   MCV 86.7 12/03/2023 0355   MCH 28.5 12/03/2023 0355   MCHC 32.9 12/03/2023 0355   RDW 14.2 12/03/2023 0355   LYMPHSABS 2.1 12/02/2023 2344   MONOABS 0.5 12/02/2023 2344   EOSABS 0.2 12/02/2023 2344   BASOSABS 0.1 12/02/2023 2344    BMET    Component Value Date/Time   NA 136 12/04/2023 1042   K 3.7 12/04/2023 1042   CL 103 12/04/2023 1042   CO2 24 12/04/2023 1042   GLUCOSE 198 (H) 12/04/2023 1042   BUN 11 12/04/2023 1042   CREATININE 1.19 12/04/2023 1042   CALCIUM  9.0 12/04/2023 1042   GFRNONAA >60 12/04/2023 1042    IMAGING past 24 hours ECHOCARDIOGRAM COMPLETE Result Date: 12/04/2023    ECHOCARDIOGRAM REPORT   Patient Name:   Jason Meadows Date of Exam: 12/04/2023 Medical Rec #:  [id number]        Height:       71.0 in Accession #:    [id number]       Weight:       243.0 lb Date of Birth:  Apr 11, 1961         BSA:          2.291 m Patient Age:    62 years         BP:           174/86 mmHg Patient Gender: M                HR:           54 bpm. Exam Location:  Inpatient Procedure: 2D Echo, Color Doppler and Cardiac Doppler (Both Spectral and Color            Flow Doppler were utilized during procedure). Indications:    Stroke  History:        Patient has no prior history of Echocardiogram examinations.                 Risk Factors:Hypertension and Diabetes.  Sonographer:    Andrena Bang Referring Phys: Roxan Copes IMPRESSIONS  1. Left ventricular ejection fraction, by estimation, is 60 to 65%. The left ventricle  has normal function. The left ventricle has no regional wall motion abnormalities. There is mild left ventricular hypertrophy. Left ventricular diastolic parameters were normal.  2. Right ventricular systolic function is normal. The right ventricular size is normal. Tricuspid regurgitation signal is inadequate for assessing PA pressure.  3. The mitral valve is normal in structure. No evidence of mitral valve regurgitation. No evidence of mitral stenosis.  4. The aortic valve is tricuspid. Aortic valve regurgitation is not visualized. Aortic valve sclerosis is present, with no evidence of aortic valve stenosis.  5. The inferior vena cava is normal in size with <50% respiratory variability, suggesting right atrial pressure of 8 mmHg. Conclusion(s)/Recommendation(s): No intracardiac source of embolism detected on this transthoracic study. Consider a transesophageal echocardiogram to exclude cardiac source of embolism if clinically indicated. FINDINGS  Left Ventricle:  Left ventricular ejection fraction, by estimation, is 60 to 65%. The left ventricle has normal function. The left ventricle has no regional wall motion abnormalities. The left ventricular internal cavity size was normal in size. There is  mild left ventricular hypertrophy. Left ventricular diastolic parameters were normal. Right Ventricle: The right ventricular size is normal. No increase in right ventricular wall thickness. Right ventricular systolic function is normal. Tricuspid regurgitation signal is inadequate for assessing PA pressure. Left Atrium: Left atrial size was normal in size. Right Atrium: Right atrial size was normal in size. Pericardium: There is no evidence of pericardial effusion. Mitral Valve: The mitral valve is normal in structure. No evidence of mitral valve regurgitation. No evidence of mitral valve stenosis. Tricuspid Valve: The tricuspid valve is normal in structure. Tricuspid valve regurgitation is not demonstrated. No evidence  of tricuspid stenosis. Aortic Valve: The aortic valve is tricuspid. Aortic valve regurgitation is not visualized. Aortic valve sclerosis is present, with no evidence of aortic valve stenosis. Aortic valve mean gradient measures 4.0 mmHg. Aortic valve peak gradient measures 7.6  mmHg. Aortic valve area, by VTI measures 2.38 cm. Pulmonic Valve: The pulmonic valve was not well visualized. Pulmonic valve regurgitation is not visualized. No evidence of pulmonic stenosis. Aorta: The aortic root and ascending aorta are structurally normal, with no evidence of dilitation. Venous: The inferior vena cava is normal in size with less than 50% respiratory variability, suggesting right atrial pressure of 8 mmHg. IAS/Shunts: The interatrial septum was not well visualized.  LEFT VENTRICLE PLAX 2D LVIDd:         4.20 cm      Diastology LVIDs:         3.10 cm      LV e' medial:    8.16 cm/s LV PW:         0.97 cm      LV E/e' medial:  11.7 LV IVS:        1.20 cm      LV e' lateral:   11.10 cm/s LVOT diam:     1.90 cm      LV E/e' lateral: 8.6 LV SV:         81 LV SV Index:   35 LVOT Area:     2.84 cm  LV Volumes (MOD) LV vol d, MOD A2C: 123.0 ml LV vol d, MOD A4C: 115.0 ml LV vol s, MOD A2C: 40.0 ml LV vol s, MOD A4C: 40.9 ml LV SV MOD A2C:     83.0 ml LV SV MOD A4C:     115.0 ml LV SV MOD BP:      80.5 ml RIGHT VENTRICLE RV S prime:     12.00 cm/s TAPSE (M-mode): 2.1 cm LEFT ATRIUM           Index LA diam:      3.70 cm 1.62 cm/m LA Vol (A2C): 62.7 ml 27.37 ml/m LA Vol (A4C): 20.0 ml 8.73 ml/m  AORTIC VALVE AV Area (Vmax):    2.36 cm AV Area (Vmean):   2.12 cm AV Area (VTI):     2.38 cm AV Vmax:           138.00 cm/s AV Vmean:          92.500 cm/s AV VTI:            0.339 m AV Peak Grad:      7.6 mmHg AV Mean Grad:      4.0 mmHg LVOT Vmax:  115.00 cm/s LVOT Vmean:        69.200 cm/s LVOT VTI:          0.285 m LVOT/AV VTI ratio: 0.84  AORTA Ao Root diam: 3.17 cm Ao Asc diam:  3.50 cm MITRAL VALVE MV Area (PHT): 3.42 cm     SHUNTS MV Decel Time: 222 msec    Systemic VTI:  0.29 m MV E velocity: 95.40 cm/s  Systemic Diam: 1.90 cm MV A velocity: 68.10 cm/s MV E/A ratio:  1.40 Sunit Tolia Electronically signed by Olinda Bertrand Signature Date/Time: 12/04/2023/12:09:50 PM    Final     Vitals:   12/04/23 0813 12/04/23 1113 12/04/23 1217 12/04/23 1249  BP: (!) 147/87 (!) 172/97 (!) 180/99 (!) 151/78  Pulse: 61 61    Resp:      Temp: 97.8 F (36.6 C) 98 F (36.7 C)    TempSrc: Oral Oral    SpO2: 97% 100%    Weight:      Height:         PHYSICAL EXAM General:  Alert, well-nourished, well-developed patient in no acute distress Psych:  Mood and affect appropriate for situation CV: Regular rate and rhythm on monitor Respiratory:  Regular, unlabored respirations on room air   NEURO:  Mental Status: AA&Ox3, patient is able to give clear and coherent history Speech/Language: speech is without dysarthria or aphasia.    Cranial Nerves:  II: PERRL. Visual fields full.  III, IV, VI: EOMI. Eyelids elevate symmetrically.  V: Sensation is intact to light touch and slightly rougher on the left VII: Face is symmetrical resting and smiling VIII: hearing intact to voice. IX, X: Phonation is normal.  HY:QMVHQION shrug 5/5. XII: tongue is midline without fasciculations. Motor: 5/5 strength to all muscle groups tested.  Tone: is normal and bulk is normal Sensation- Intact to light touch bilaterally but diminished at right foot (patient is postsurgery) Coordination: FTN intact bilaterally Gait- deferred  Most Recent NIH  1a Level of Conscious.: 0 1b LOC Questions: 0 1c LOC Commands: 0 2 Best Gaze: 0 3 Visual: 0 4 Facial Palsy: 0 5a Motor Arm - left: 0 5b Motor Arm - Right: 0 6a Motor Leg - Left: 0 6b Motor Leg - Right: 0 7 Limb Ataxia: 0 8 Sensory: 1 9 Best Language: 0 10 Dysarthria: 0 11 Extinct. and Inatten.: 0 TOTAL: 1   ASSESSMENT/PLAN  Jason Meadows is a 63 y.o. male with history of  hypertension, hyperlipidemia, smoking and diabetes admitted for sudden onset left-sided weakness.  Patient states he was doing some yard work and suddenly felt weakness of the left leg with disequilibrium and imbalance and numbness of the left arm and leg.  MRI was negative for acute infarct, and symptoms have largely resolved.  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  He will follow-up with them as an outpatient for this.  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 CT head No acute abnormality. Small vessel disease.  CTA head & neck no LVO or hemodynamically significant stenosis MRI no acute abnormality, chronic small vessel ischemic disease, 2 lesions in the right pons consistent with cavernoma 2D Echo EF 60 to 65% LDL 166 HgbA1c 8.2 UDS negative VTE prophylaxis -SCDs No antithrombotic prior to admission, now on aspirin  81 mg daily and clopidogrel  75 mg daily for  3 weeks and then aspirin  alone. Therapy recommendations: Outpatient PT Disposition: home  Hypertension Home meds: Senna Pearl-hydrochlorothiazide 10-12.5 mg daily Stable on the high end Home BP monitoring Maintain normotension  Hyperlipidemia Home meds: none, supposed to be on Lipitor 40, noncompliant LDL 166, goal < 70 Now on Lipitor 80 Continue statin at discharge  Diabetes type II  Home meds: Forman 500 mg daily HgbA1c 8.2, goal < 7.0 CBGs SSI Recommend close follow-up with PCP for better DM control  Tobacco Abuse Patient smokes less than 1 pack per day  Discussed cessation with patient Nicotine  replacement therapy provided Patient is willing to quit  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 Please call with questions. Pt will follow up with stroke clinic NP at [facility] in about 4 weeks. Thanks for the consult.  Consuelo Denmark, MD PhD Stroke Neurology 12/04/2023 3:35 PM    To contact Stroke Continuity provider, please refer to [email or web address]. After hours, contact General Neurology
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
## 2023-12-04 NOTE — Plan of Care (Signed)

## 2023-12-04 NOTE — Progress Notes (Signed)
  Echocardiogram 2D Echocardiogram has been performed.  Annis Kinder, RDCS 12/04/2023, 8:56 AM

## 2023-12-06 ENCOUNTER — Other Ambulatory Visit (HOSPITAL_COMMUNITY): Payer: Self-pay

## 2023-12-06 LAB — POCT I-STAT, CHEM 8
BUN: 8 mg/dL (ref 8–23)
Calcium, Ion: 1.1 mmol/L — ABNORMAL LOW (ref 1.15–1.40)
Chloride: 102 mmol/L (ref 98–111)
Creatinine, Ser: 1.4 mg/dL — ABNORMAL HIGH (ref 0.61–1.24)
Glucose, Bld: 179 mg/dL — ABNORMAL HIGH (ref 70–99)
HCT: 45 % (ref 39.0–52.0)
Hemoglobin: 15.3 g/dL (ref 13.0–17.0)
Potassium: 3.5 mmol/L (ref 3.5–5.1)
Sodium: 140 mmol/L (ref 135–145)
TCO2: 26 mmol/L (ref 22–32)

## 2023-12-07 ENCOUNTER — Observation Stay (HOSPITAL_COMMUNITY)
Admission: EM | Admit: 2023-12-07 | Discharge: 2023-12-08 | Disposition: A | Discharge status: Home / Self Care | Attending: Internal Medicine | Admitting: Internal Medicine

## 2023-12-07 ENCOUNTER — Other Ambulatory Visit: Payer: Self-pay

## 2023-12-07 ENCOUNTER — Encounter (HOSPITAL_COMMUNITY): Payer: Self-pay

## 2023-12-07 ENCOUNTER — Emergency Department (HOSPITAL_COMMUNITY)

## 2023-12-07 DIAGNOSIS — E669 Obesity, unspecified: Secondary | ICD-10-CM | POA: Insufficient documentation

## 2023-12-07 DIAGNOSIS — F17210 Nicotine dependence, cigarettes, uncomplicated: Secondary | ICD-10-CM | POA: Diagnosis not present

## 2023-12-07 DIAGNOSIS — Z7902 Long term (current) use of antithrombotics/antiplatelets: Secondary | ICD-10-CM | POA: Insufficient documentation

## 2023-12-07 DIAGNOSIS — I6381 Other cerebral infarction due to occlusion or stenosis of small artery: Principal | ICD-10-CM | POA: Insufficient documentation

## 2023-12-07 DIAGNOSIS — I639 Cerebral infarction, unspecified: Secondary | ICD-10-CM | POA: Diagnosis not present

## 2023-12-07 DIAGNOSIS — N179 Acute kidney failure, unspecified: Secondary | ICD-10-CM | POA: Diagnosis not present

## 2023-12-07 DIAGNOSIS — D751 Secondary polycythemia: Secondary | ICD-10-CM

## 2023-12-07 DIAGNOSIS — Z7984 Long term (current) use of oral hypoglycemic drugs: Secondary | ICD-10-CM

## 2023-12-07 DIAGNOSIS — N289 Disorder of kidney and ureter, unspecified: Secondary | ICD-10-CM

## 2023-12-07 DIAGNOSIS — Z79899 Other long term (current) drug therapy: Secondary | ICD-10-CM | POA: Diagnosis not present

## 2023-12-07 DIAGNOSIS — R739 Hyperglycemia, unspecified: Secondary | ICD-10-CM

## 2023-12-07 DIAGNOSIS — R29701 NIHSS score 1: Secondary | ICD-10-CM

## 2023-12-07 DIAGNOSIS — E119 Type 2 diabetes mellitus without complications: Secondary | ICD-10-CM

## 2023-12-07 DIAGNOSIS — Z7982 Long term (current) use of aspirin: Secondary | ICD-10-CM | POA: Diagnosis not present

## 2023-12-07 DIAGNOSIS — I10 Essential (primary) hypertension: Secondary | ICD-10-CM | POA: Diagnosis not present

## 2023-12-07 DIAGNOSIS — Z6833 Body mass index (BMI) 33.0-33.9, adult: Secondary | ICD-10-CM | POA: Insufficient documentation

## 2023-12-07 DIAGNOSIS — R066 Hiccough: Secondary | ICD-10-CM | POA: Insufficient documentation

## 2023-12-07 DIAGNOSIS — E785 Hyperlipidemia, unspecified: Secondary | ICD-10-CM | POA: Diagnosis not present

## 2023-12-07 DIAGNOSIS — E1151 Type 2 diabetes mellitus with diabetic peripheral angiopathy without gangrene: Secondary | ICD-10-CM

## 2023-12-07 DIAGNOSIS — R17 Unspecified jaundice: Secondary | ICD-10-CM

## 2023-12-07 DIAGNOSIS — R200 Anesthesia of skin: Secondary | ICD-10-CM | POA: Diagnosis present

## 2023-12-07 LAB — COMPREHENSIVE METABOLIC PANEL WITH GFR
ALT: 23 U/L (ref 0–44)
AST: 23 U/L (ref 15–41)
Albumin: 4.1 g/dL (ref 3.5–5.0)
Alkaline Phosphatase: 81 U/L (ref 38–126)
Anion gap: 13 (ref 5–15)
BUN: 21 mg/dL (ref 8–23)
CO2: 26 mmol/L (ref 22–32)
Calcium: 10.1 mg/dL (ref 8.9–10.3)
Chloride: 100 mmol/L (ref 98–111)
Creatinine, Ser: 1.43 mg/dL — ABNORMAL HIGH (ref 0.61–1.24)
GFR, Estimated: 55 mL/min — ABNORMAL LOW (ref >60)
Glucose, Bld: 137 mg/dL — ABNORMAL HIGH (ref 70–99)
Potassium: 3.8 mmol/L (ref 3.5–5.1)
Sodium: 139 mmol/L (ref 135–145)
Total Bilirubin: 1.3 mg/dL — ABNORMAL HIGH (ref 0.0–1.2)
Total Protein: 7.3 g/dL (ref 6.5–8.1)

## 2023-12-07 LAB — DIFFERENTIAL
Abs Immature Granulocytes: 0.04 10*3/uL (ref 0.00–0.07)
Basophils Absolute: 0.1 10*3/uL (ref 0.0–0.1)
Basophils Relative: 1 %
Eosinophils Absolute: 0.1 10*3/uL (ref 0.0–0.5)
Eosinophils Relative: 1 %
Immature Granulocytes: 0 %
Lymphocytes Relative: 16 %
Lymphs Abs: 1.7 10*3/uL (ref 0.7–4.0)
Monocytes Absolute: 0.8 10*3/uL (ref 0.1–1.0)
Monocytes Relative: 8 %
Neutro Abs: 7.6 10*3/uL (ref 1.7–7.7)
Neutrophils Relative %: 74 %

## 2023-12-07 LAB — CBC
HCT: 51.3 % (ref 39.0–52.0)
Hemoglobin: 17.1 g/dL — ABNORMAL HIGH (ref 13.0–17.0)
MCH: 28.8 pg (ref 26.0–34.0)
MCHC: 33.3 g/dL (ref 30.0–36.0)
MCV: 86.5 fL (ref 80.0–100.0)
Platelets: 214 10*3/uL (ref 150–400)
RBC: 5.93 MIL/uL — ABNORMAL HIGH (ref 4.22–5.81)
RDW: 14.3 % (ref 11.5–15.5)
WBC: 10.3 10*3/uL (ref 4.0–10.5)
nRBC: 0 % (ref 0.0–0.2)

## 2023-12-07 LAB — GLUCOSE, CAPILLARY
Glucose-Capillary: 172 mg/dL — ABNORMAL HIGH (ref 70–99)
Glucose-Capillary: 188 mg/dL — ABNORMAL HIGH (ref 70–99)

## 2023-12-07 LAB — APTT: aPTT: 26 s (ref 24–36)

## 2023-12-07 LAB — PROTIME-INR
INR: 1.1 (ref 0.8–1.2)
Prothrombin Time: 14.4 s (ref 11.4–15.2)

## 2023-12-07 LAB — ETHANOL: Alcohol, Ethyl (B): <15 mg/dL (ref <15)

## 2023-12-07 MED ORDER — ASPIRIN 300 MG RE SUPP: 300.0000 mg | Freq: Every day | RECTAL | Status: DC

## 2023-12-07 MED ORDER — INSULIN ASPART 100 UNIT/ML IJ SOLN: 0.0000 [IU] | Freq: Three times a day (TID) | INTRAMUSCULAR | Status: DC

## 2023-12-07 MED ORDER — CHLORPROMAZINE HCL 25 MG PO TABS
25.0000 mg | ORAL_TABLET | Freq: Once | ORAL | Status: AC
  Administered 2023-12-07: 25 mg via ORAL
  Filled 2023-12-07: qty 1

## 2023-12-07 MED ORDER — ONDANSETRON HCL 4 MG/2ML IJ SOLN
4.0000 mg | Freq: Four times a day (QID) | INTRAMUSCULAR | Status: DC | PRN
  Administered 2023-12-07: 4 mg via INTRAVENOUS
  Filled 2023-12-07: qty 2

## 2023-12-07 MED ORDER — CLOPIDOGREL BISULFATE 75 MG PO TABS
75.0000 mg | ORAL_TABLET | Freq: Every day | ORAL | Status: DC
Start: 2023-12-07 — End: 2023-12-08
  Administered 2023-12-07 – 2023-12-08 (×2): 75 mg via ORAL
  Filled 2023-12-07 (×2): qty 1

## 2023-12-07 MED ORDER — NICOTINE 21 MG/24HR TD PT24
21.0000 mg | MEDICATED_PATCH | Freq: Every day | TRANSDERMAL | Status: DC
Start: 2023-12-07 — End: 2023-12-08
  Administered 2023-12-07 – 2023-12-08 (×2): 21 mg via TRANSDERMAL
  Filled 2023-12-07 (×2): qty 1

## 2023-12-07 MED ORDER — SENNOSIDES-DOCUSATE SODIUM 8.6-50 MG PO TABS: 1.0000 | ORAL_TABLET | Freq: Every evening | ORAL | Status: DC | PRN

## 2023-12-07 MED ORDER — ACETAMINOPHEN 325 MG PO TABS: 650.0000 mg | ORAL_TABLET | ORAL | Status: DC | PRN

## 2023-12-07 MED ORDER — ACETAMINOPHEN 160 MG/5ML PO SOLN: 650.0000 mg | ORAL | Status: DC | PRN

## 2023-12-07 MED ORDER — PROCHLORPERAZINE EDISYLATE 10 MG/2ML IJ SOLN
10.0000 mg | Freq: Once | INTRAMUSCULAR | Status: AC
  Administered 2023-12-07: 10 mg via INTRAVENOUS
  Filled 2023-12-07: qty 2

## 2023-12-07 MED ORDER — ACETAMINOPHEN 650 MG RE SUPP: 650.0000 mg | RECTAL | Status: DC | PRN

## 2023-12-07 MED ORDER — ENOXAPARIN SODIUM 40 MG/0.4ML IJ SOSY
40.0000 mg | PREFILLED_SYRINGE | INTRAMUSCULAR | Status: DC
  Administered 2023-12-07: 40 mg via SUBCUTANEOUS
  Filled 2023-12-07: qty 0.4

## 2023-12-07 MED ORDER — PANTOPRAZOLE SODIUM 40 MG IV SOLR
40.0000 mg | INTRAVENOUS | Status: DC
  Administered 2023-12-07: 40 mg via INTRAVENOUS
  Filled 2023-12-07: qty 10

## 2023-12-07 MED ORDER — STROKE: EARLY STAGES OF RECOVERY BOOK
Freq: Once | Status: AC
  Filled 2023-12-07: qty 1

## 2023-12-07 MED ORDER — ATORVASTATIN CALCIUM 80 MG PO TABS
80.0000 mg | ORAL_TABLET | Freq: Every day | ORAL | Status: DC
  Administered 2023-12-07 – 2023-12-08 (×2): 80 mg via ORAL
  Filled 2023-12-07 (×2): qty 1

## 2023-12-07 MED ORDER — ASPIRIN 81 MG PO TBEC
81.0000 mg | DELAYED_RELEASE_TABLET | Freq: Every day | ORAL | Status: DC
  Administered 2023-12-07 – 2023-12-08 (×2): 81 mg via ORAL
  Filled 2023-12-07 (×2): qty 1

## 2023-12-07 MED ORDER — GADOBUTROL 1 MMOL/ML IV SOLN
10.0000 mL | Freq: Once | INTRAVENOUS | Status: AC | PRN
  Administered 2023-12-07: 10 mL via INTRAVENOUS

## 2023-12-07 MED ORDER — SODIUM CHLORIDE 0.9 % IV SOLN: INTRAVENOUS | Status: AC

## 2023-12-07 NOTE — ED Triage Notes (Signed)
 Pt BIB [facility] from home. Pt has been hiccupping for the past 2 hours, to the point of vomiting. Pt has had left sided numbness in his face and has had a left sided gait abnormality that has been ongoing for about a week.   EM[facility]  156/92 BP 50-60sP 96% RA 20 LAC 4mg  zofran 

## 2023-12-07 NOTE — TOC CAGE-AID Note (Signed)
 Transition of Care [facility]) - CAGE-AID Screening   Patient Details  Name: Jason Meadows MRN: [id number] Date of Birth: 03-Mar-1961  Transition of Care [facility]) CM/SW Contact:    Susumu Hackler E Ruwayda Curet, LCSW Phone Number: 12/07/2023, 3:12 PM   Clinical Narrative: Patient denied substance use.   CAGE-AID Screening:    Have You Ever Felt You Ought to Cut Down on Your Drinking or Drug Use?: No Have People Annoyed You By Critizing Your Drinking Or Drug Use?: No Have You Felt Bad Or Guilty About Your Drinking Or Drug Use?: No Have You Ever Had a Drink or Used Drugs First Thing In The Morning to Steady Your Nerves or to Get Rid of a Hangover?: No CAGE-AID Score: 0  Substance Abuse Education Offered: No

## 2023-12-07 NOTE — ED Notes (Signed)
 Patient transported to MRI

## 2023-12-07 NOTE — ED Provider Notes (Signed)
  Physical Exam  BP (!) 127/90   Pulse 63   Temp 98.3 F (36.8 C) (Oral)   Resp (!) 9   Ht 5\' 11"  (1.803 m)   Wt 110 kg   SpO2 98%   BMI 33.82 kg/m   Physical Exam  Procedures  Procedures  ED Course / MDM    Medical Decision Making Amount and/or Complexity of Data Reviewed Labs: ordered. Radiology: ordered.  Risk Prescription drug management. Decision regarding hospitalization.   Got message that page to hospitalist had never been placed.  Now has been placed.       Mozell Arias, MD 12/07/23 1254

## 2023-12-07 NOTE — ED Notes (Addendum)
 Patient back from MRI.

## 2023-12-07 NOTE — Discharge Instructions (Signed)
 Dear Jason Meadows,   Congratulations for your interest in quitting smoking!  Find a program that suits you best: when you want to quit, how you need support, where you live, and how you like to learn.    If you're ready to get started TODAY, consider scheduling a visit through [facility] [email or web address].  Appointments are available from 8am to 8pm, Monday to Friday.   Most health insurance plans will cover some level of tobacco cessation visits and medications.    Additional Resources: [employer] are also available to help you quit & provide the support you'll need. Many programs are available in both [place] and Spanish and have a long history of successfully helping people get off and stay off tobacco.    Quit Smoking Apps:  quitSTART at [employer].de QuitGuide?at [employer]get[employer].dk Online education and resources: Smokefree  at [employer].gov Free Telephone Coaching: QuitNow,  Call 1-800-QUIT-NOW (843-673-4525) or Text- Ready to 231-377-0354 *Quitline [place] has teamed up with Medicaid to offer a free 14 week program    Vaping- Want to Quit? Free 24/7 support. Call [facility]  [place], [place], [place], [place], [place]  [facility]  Additional Discharge Instructions   Please get your medications reviewed and adjusted by your Primary MD.  Please request your Primary MD to go over all Hospital Tests and Procedure/Radiological results at the follow up, please get all Hospital records sent to your Primary MD by signing hospital release before you go home.  If you had Pneumonia of Lung problems at the Hospital: Please get a 2 view Chest X ray done in approximately 4 weeks after hospital discharge or sooner if instructed by your Primary MD.  If you have Congestive Heart Failure: Please call your Cardiologist or Primary MD anytime you have any of the following symptoms:  1) 3 pound weight gain in 24 hours or 5  pounds in 1 week  2) shortness of breath, with or without a dry hacking cough  3) swelling in the hands, feet or stomach  4) if you have to sleep on extra pillows at night in order to breathe  Follow cardiac low salt diet and 1.5 lit/day fluid restriction.  If you have diabetes Accuchecks 4 times/day, Once in AM empty stomach and then before each meal. Log in all results and show them to your primary doctor at your next visit. If any glucose reading is under 80 or above 300 call your primary MD immediately.  If you have Seizure/Convulsions/Epilepsy: Please do not drive, operate heavy machinery, participate in activities at heights or participate in high speed sports until you have seen by Primary MD or a Neurologist and advised to do so again. Per [place]  DMV statutes, patients with seizures are not allowed to drive until they have been seizure-free for six months.  Use caution when using heavy equipment or power tools. Avoid working on ladders or at heights. Take showers instead of baths. Ensure the water temperature is not too high on the home water heater. Do not go swimming alone. Do not lock yourself in a room alone (i.e. bathroom). When caring for infants or small children, sit down when holding, feeding, or changing them to minimize risk of injury to the child in the event you have a seizure. Maintain good sleep hygiene. Avoid alcohol.   If you had Gastrointestinal Bleeding: Please ask your Primary MD to check a complete blood count within one week of discharge or at your next visit. Your endoscopic/colonoscopic biopsies that are  pending at the time of discharge, will also need to followed by your Primary MD.  Get Medicines reviewed and adjusted. Please take all your medications with you for your next visit with your Primary MD  Please request your Primary MD to go over all hospital tests and procedure/radiological results at the follow up, please ask your Primary MD to get all  Hospital records sent to his/her office.  If you experience worsening of your admission symptoms, develop shortness of breath, life threatening emergency, suicidal or homicidal thoughts you must seek medical attention immediately by calling 911 or calling your MD immediately  if symptoms less severe.  You must read complete instructions/literature along with all the possible adverse reactions/side effects for all the Medicines you take and that have been prescribed to you. Take any new Medicines after you have completely understood and accpet all the possible adverse reactions/side effects.   Do not drive or operate heavy machinery when taking Pain medications.   Do not take more than prescribed Pain, Sleep and Anxiety Medications  Special Instructions: If you have smoked or chewed Tobacco  in the last 2 yrs please stop smoking, stop any regular Alcohol  and or any Recreational drug use.  Wear Seat belts while driving.  Please note You were cared for by a hospitalist during your hospital stay. If you have any questions about your discharge medications or the care you received while you were in the hospital after you are discharged, you can call the unit and asked to speak with the hospitalist on call if the hospitalist that took care of you is not available. Once you are discharged, your primary care physician will handle any further medical issues. Please note that NO REFILLS for any discharge medications will be authorized once you are discharged, as it is imperative that you return to your primary care physician (or establish a relationship with a primary care physician if you do not have one) for your aftercare needs so that they can reassess your need for medications and monitor your lab values.  You can reach the hospitalist office at phone [phone] or fax [phone]   If you do not have a primary care physician, you can call [phone] for a physician referral.

## 2023-12-07 NOTE — Consult Note (Addendum)
 Stroke consult note  Chief complaint: Hiccups Referring provider: Dr. Candelaria Chaco  HPI: 64 year old man past history of diabetes, hypertension, hyperlipidemia, smoker who presents with complaints of worsening left-sided numbness and hiccups.  He was seen on 12/03/2023 when he presented with sudden onset of left-sided weakness while mowing his lawn as well as feeling unstable and falling to the left.  Further evaluation with CT and MRI imaging was unremarkable to explain the symptoms-she did have 2 cavernoma's in the pons which will be followed by neurosurgery outpatient and from the neurology team perspective, he was treated like a TIA and discharged home after completion of the risk factor workup.  Ever since being home, his symptoms have not resolved and yesterday, he started having hiccups that would not resolve and became so bad that he almost had nausea and vomiting. His gait instability has also not improved and has somewhat worsened.  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  Review of system: Performed and documented pertinent positives in the HPI. Family history: Nothing pertinent Social history: Smokes about a pack of cigarettes a day.  Does not drink alcohol or use illicit drugs.  Last known well: 12/02/2023 Modified Rankin: Since last admission, he is a modified Rankin of 2. IV thrombolysis: Outside the window EVT: Outside the window  OBJECTIVE Vitals:   12/07/23 0145 12/07/23 0255 12/07/23 0448 12/07/23 0450  BP: (!) 161/92 130/87  (!) 144/93  Pulse: (!) 58 62  71  Resp: 14 17  13   Temp:   98.1 F (36.7 C)   TempSrc:   Oral   SpO2: 100% 100%  100%  Weight:      Height:      PHYSICAL EXAM General:  Alert, well-nourished, well-developed patient in no acute distress Psych:  Mood and affect appropriate for situation CV: Regular rate and rhythm on monitor Respiratory:  Regular, unlabored respirations on room air NEURO:  Mental Status: AA&Ox3, patient  is able to give clear and coherent history Speech/Language: speech is without dysarthria or aphasia.   Cranial Nerves:  II: PERRL. Visual fields full.  III, IV, VI: EOMI. Eyelids elevate symmetrically.  V: Sensation is intact to light touch and slightly rougher on the left VII: Face is symmetrical resting and smiling VIII: hearing intact to voice. IX, X: Phonation is normal.  ZO:[id number] shrug 5/5. XII: tongue is midline without fasciculations. Motor: 5/5 strength to all muscle groups tested.  Tone: is normal and bulk is normal Sensation- Intact to light touch bilaterally but diminished at right foot (patient is postsurgery) Coordination: FTN intact bilaterally Gait- deferred  NIHSS  1a Level of Conscious.: 0 1b LOC Questions: 0 1c LOC Commands: 0 2 Best Gaze: 0 3 Visual: 0 4 Facial Palsy: 0 5a Motor Arm - left: 0 5b Motor Arm - Right: 0 6a Motor Leg - Left: 0 6b Motor Leg - Right: 0 7 Limb Ataxia: 0 8 Sensory: 1 9 Best Language: 0 10 Dysarthria: 0 11 Extinct. and Inatten.: 0 TOTAL: 1  CBC    Component Value Date/Time   WBC 10.3 12/07/2023 0355   RBC 5.93 (H) 12/07/2023 0355   HGB 17.1 (H) 12/07/2023 0355   HCT 51.3 12/07/2023 0355   PLT 214 12/07/2023 0355   MCV 86.5 12/07/2023 0355   MCH 28.8 12/07/2023 0355   MCHC 33.3 12/07/2023 0355   RDW 14.3 12/07/2023 0355   LYMPHSABS 1.7 12/07/2023 0355   MONOABS 0.8 12/07/2023 0355   EOSABS 0.1 12/07/2023 0355  BASOSABS 0.1 12/07/2023 0355    BMET    Component Value Date/Time   NA 139 12/07/2023 0355   K 3.8 12/07/2023 0355   CL 100 12/07/2023 0355   CO2 26 12/07/2023 0355   GLUCOSE 137 (H) 12/07/2023 0355   BUN 21 12/07/2023 0355   CREATININE 1.43 (H) 12/07/2023 0355   CALCIUM  10.1 12/07/2023 0355   GFRNONAA 55 (L) 12/07/2023 0355   Imaging personally reviewed: Today: MR brain: 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  Underlying advanced chronic microvascular ischemic  disease with remote right basal ganglia lacunar infarcts.  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  No recent or interval hemorrhage.  MR brain 12/03/2023-no acute findings.  2 lesions in the right pons consistent with cavernoma with no recent hemorrhage.  On my personal review of this, there might have been a very small area in the left lateral medulla that had DWI restriction but very hard to say given small size of the stroke and extremely large slice thickness and gaps.  CT angiography head and neck 12/03/2023.  With no emergent LVO.  ASSESSMENT/PLAN  Mr. Jason Meadows is a 63 y.o. male with history of hypertension, hyperlipidemia, smoking and diabetes admitted for sudden onset left-sided weakness, with imaging unremarkable for acute process on 12/03/2023.  Patient states he was doing some yard work and suddenly felt weakness of the left leg with disequilibrium and imbalance and numbness of the left arm and leg.  MRI was negative for acute infarct.  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  He will follow-up with them as an outpatient for this.   Return back for numbness in the face and intractable hiccups.  Repeat MRI reveals left lateral medullary infarct.  Labs also reveal AKI.  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 Small vessel disease.  CTA head & neck no LVO or hemodynamically significant stenosis-no need to repeat. MRI brain today-left lateral medullary infarct.  Redemonstrated pontine cavernoma's. 2D Echo EF 60 to 65% LDL 166 HgbA1c 8.2 UDS negative VTE prophylaxis -SCDs No antithrombotic prior to admission, now on aspirin  81 mg daily and clopidogrel  75 mg daily for 3 weeks and then aspirin  alone.  Continue this plan. Therapy recommendations: Needs  reassessment by PT OT and speech therapy Disposition: home  Hiccups-likely secondary to the medullary infarct Symptomatic management per primary team IV fluids Can try baclofen or gabapentin if remains problematic. At the time of my encounter-was hiccup free.  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   Stroke team will be available to answer questions.  -- Tona Francis, MD Neurologist [facility]

## 2023-12-07 NOTE — Progress Notes (Signed)
 STROKE TEAM PROGRESS NOTE   SUBJECTIVE (INTERVAL HISTORY) His wife is at the bedside.  Overall his condition is stable. 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  MRI showed left medullary infarct.  Pending swallow   OBJECTIVE Temp:  [97.3 F (36.3 C)-98.3 F (36.8 C)] 98.3 F (36.8 C) (04/29 0907) Pulse Rate:  [57-75] 64 (04/29 0845) Cardiac Rhythm: Normal sinus rhythm;Other (Comment) (04/29 0529) Resp:  [9-18] 11 (04/29 0845) BP: (130-161)/(22-93) 135/83 (04/29 0845) SpO2:  [98 %-100 %] 98 % (04/29 0845) Weight:  [110 kg] 110 kg (04/29 0122)  Recent Labs  Lab 12/03/23 1656 12/03/23 2213 12/04/23 0627 12/04/23 0814 12/04/23 1109  GLUCAP 174* 127* 182* 157* 179*   Recent Labs  Lab 12/02/23 2344 12/03/23 0010 12/03/23 0049 12/03/23 0355 12/04/23 1042 12/07/23 0355  NA 138 140 139 136 136 139  K 3.5 3.5 4.6 3.5 3.7 3.8  CL 102 102 101 101 103 100  CO2 26  --   --  24 24 26   GLUCOSE 180* 179* 176* 145* 198* 137*  BUN 9 8 11 10 11 21   CREATININE 1.32* 1.40* 1.40* 1.27* 1.19 1.43*  CALCIUM  9.0  --   --  8.8* 9.0 10.1  PHOS  --   --   --   --  2.5  --    Recent Labs  Lab 12/02/23 2344 12/04/23 1042 12/07/23 0355  AST 24  --  23  ALT 20  --  23  ALKPHOS 82  --  81  BILITOT 0.8  --  1.3*  PROT 6.7  --  7.3  ALBUMIN 3.7 3.5 4.1   Recent Labs  Lab 12/02/23 2344 12/03/23 0010 12/03/23 0049 12/03/23 0355 12/07/23 0355  WBC 6.9  --   --  6.4 10.3  NEUTROABS 4.0  --   --   --  7.6  HGB 14.9 15.3 15.6 14.4 17.1*  HCT 44.4 45.0 46.0 43.8 51.3  MCV 86.7  --   --  86.7 86.5  PLT 200  --   --  182 214   No results for input(s): "CKTOTAL", "CKMB", "CKMBINDEX", "TROPONINI" in the last 168 hours. Recent Labs    12/07/23 0355  LABPROT 14.4  INR 1.1   No results for input(s): "COLORURINE", "LABSPEC", "PHURINE", "GLUCOSEU", "HGBUR", "BILIRUBINUR", "KETONESUR", "PROTEINUR", "UROBILINOGEN", "NITRITE", "LEUKOCYTESUR" in  the last 72 hours.  Invalid input(s): "APPERANCEUR"     Component Value Date/Time   CHOL 209 (H) 12/03/2023 0355   TRIG 127 12/03/2023 0355   HDL 18 (L) 12/03/2023 0355   CHOLHDL 11.6 12/03/2023 0355   VLDL 25 12/03/2023 0355   LDLCALC 166 (H) 12/03/2023 0355   Lab Results  Component Value Date   HGBA1C 8.2 (H) 12/04/2023      Component Value Date/Time   LABOPIA NONE DETECTED 12/04/2023 0956   COCAINSCRNUR NONE DETECTED 12/04/2023 0956   LABBENZ NONE DETECTED 12/04/2023 0956   AMPHETMU NONE DETECTED 12/04/2023 0956   THCU NONE DETECTED 12/04/2023 0956   LABBARB NONE DETECTED 12/04/2023 0956    Recent Labs  Lab 12/02/23 2344 12/07/23 0355  ETH <15 <15    I have personally reviewed the radiological images below and agree with the radiology interpretations.  MR Brain W and Wo Contrast Result Date: 12/07/2023 CLINICAL DATA:  Initial evaluation for acute neuro deficit, stroke suspected. EXAM: MRI HEAD WITHOUT AND WITH CONTRAST TECHNIQUE: Multiplanar, multiecho pulse sequences of the brain and surrounding structures were obtained  without and with intravenous contrast. CONTRAST:  10mL GADAVIST GADOBUTROL 1 MMOL/ML IV SOLN COMPARISON:  Prior study from 12/03/2023. FINDINGS: Brain: Cerebral volume within normal limits for age. Patchy T2/FLAIR hyperintensity involving the periventricular and deep white matter both cerebral hemispheres as well as the pons, consistent with chronic small vessel ischemic disease, fairly advanced in nature. Remote right basal ganglia lacunar infarcts noted. 8 mm acute ischemic nonhemorrhagic infarcts seen involving the left lateral medulla (series 5, image 59). No significant mass effect. No other evidence for acute or subacute ischemia. Previously identified 2 small cavernomas measuring up to approximately 9 mm seen involving the right pons, stable. No evidence for recent or interval hemorrhage. No other acute intracranial hemorrhage. Multiple additional  scattered chronic micro hemorrhages noted elsewhere, likely hypertensive/small vessel related. No other mass lesion, mass effect or midline shift. No hydrocephalus or extra-axial fluid collection. Pituitary gland within normal limits. No other abnormal enhancement. Vascular: Major intracranial vascular flow voids are maintained. Skull and upper cervical spine: Craniocervical junction within normal limits. Bone marrow signal intensity within normal limits. No scalp soft tissue abnormality. Sinuses/Orbits: Globes orbital soft tissues within normal limits. Paranasal sinuses are clear. Trace bilateral mastoid effusions, of doubtful significance. Image nasopharynx unremarkable. Other: None. IMPRESSION: 1. 8 mm acute ischemic nonhemorrhagic left lateral medullary infarct. 2. Underlying advanced chronic microvascular ischemic disease with remote right basal ganglia lacunar infarcts. 3. 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 No evidence for recent or interval hemorrhage. Electronically Signed   By: Virgia Griffins M.D.   On: 12/07/2023 03:31   ECHOCARDIOGRAM COMPLETE Result Date: 12/04/2023    ECHOCARDIOGRAM REPORT   Patient Name:   Jason Meadows Date of Exam: 12/04/2023 Medical Rec #:  [id number]        Height:       71.0 in Accession #:    [id number]       Weight:       243.0 lb Date of Birth:  1960/10/23         BSA:          2.291 m Patient Age:    63 years         BP:           174/86 mmHg Patient Gender: M                HR:           54 bpm. Exam Location:  Inpatient Procedure: 2D Echo, Color Doppler and Cardiac Doppler (Both Spectral and Color            Flow Doppler were utilized during procedure). Indications:    Stroke  History:        Patient has no prior history of Echocardiogram examinations.                 Risk Factors:Hypertension and Diabetes.  Sonographer:    Andrena Bang Referring Phys: Roxan Copes IMPRESSIONS  1. Left ventricular ejection fraction, by estimation, is 60 to 65%. The  left ventricle has normal function. The left ventricle has no regional wall motion abnormalities. There is mild left ventricular hypertrophy. Left ventricular diastolic parameters were normal.  2. Right ventricular systolic function is normal. The right ventricular size is normal. Tricuspid regurgitation signal is inadequate for assessing PA pressure.  3. The mitral valve is normal in structure. No evidence of mitral valve regurgitation. No evidence of mitral stenosis.  4. The aortic valve is tricuspid. Aortic valve regurgitation is not  visualized. Aortic valve sclerosis is present, with no evidence of aortic valve stenosis.  5. The inferior vena cava is normal in size with <50% respiratory variability, suggesting right atrial pressure of 8 mmHg. Conclusion(s)/Recommendation(s): No intracardiac source of embolism detected on this transthoracic study. Consider a transesophageal echocardiogram to exclude cardiac source of embolism if clinically indicated. FINDINGS  Left Ventricle: Left ventricular ejection fraction, by estimation, is 60 to 65%. The left ventricle has normal function. The left ventricle has no regional wall motion abnormalities. The left ventricular internal cavity size was normal in size. There is  mild left ventricular hypertrophy. Left ventricular diastolic parameters were normal. Right Ventricle: The right ventricular size is normal. No increase in right ventricular wall thickness. Right ventricular systolic function is normal. Tricuspid regurgitation signal is inadequate for assessing PA pressure. Left Atrium: Left atrial size was normal in size. Right Atrium: Right atrial size was normal in size. Pericardium: There is no evidence of pericardial effusion. Mitral Valve: The mitral valve is normal in structure. No evidence of mitral valve regurgitation. No evidence of mitral valve stenosis. Tricuspid Valve: The tricuspid valve is normal in structure. Tricuspid valve regurgitation is not  demonstrated. No evidence of tricuspid stenosis. Aortic Valve: The aortic valve is tricuspid. Aortic valve regurgitation is not visualized. Aortic valve sclerosis is present, with no evidence of aortic valve stenosis. Aortic valve mean gradient measures 4.0 mmHg. Aortic valve peak gradient measures 7.6  mmHg. Aortic valve area, by VTI measures 2.38 cm. Pulmonic Valve: The pulmonic valve was not well visualized. Pulmonic valve regurgitation is not visualized. No evidence of pulmonic stenosis. Aorta: The aortic root and ascending aorta are structurally normal, with no evidence of dilitation. Venous: The inferior vena cava is normal in size with less than 50% respiratory variability, suggesting right atrial pressure of 8 mmHg. IAS/Shunts: The interatrial septum was not well visualized.  LEFT VENTRICLE PLAX 2D LVIDd:         4.20 cm      Diastology LVIDs:         3.10 cm      LV e' medial:    8.16 cm/s LV PW:         0.97 cm      LV E/e' medial:  11.7 LV IVS:        1.20 cm      LV e' lateral:   11.10 cm/s LVOT diam:     1.90 cm      LV E/e' lateral: 8.6 LV SV:         81 LV SV Index:   35 LVOT Area:     2.84 cm  LV Volumes (MOD) LV vol d, MOD A2C: 123.0 ml LV vol d, MOD A4C: 115.0 ml LV vol s, MOD A2C: 40.0 ml LV vol s, MOD A4C: 40.9 ml LV SV MOD A2C:     83.0 ml LV SV MOD A4C:     115.0 ml LV SV MOD BP:      80.5 ml RIGHT VENTRICLE RV S prime:     12.00 cm/s TAPSE (M-mode): 2.1 cm LEFT ATRIUM           Index LA diam:      3.70 cm 1.62 cm/m LA Vol (A2C): 62.7 ml 27.37 ml/m LA Vol (A4C): 20.0 ml 8.73 ml/m  AORTIC VALVE AV Area (Vmax):    2.36 cm AV Area (Vmean):   2.12 cm AV Area (VTI):     2.38 cm AV Vmax:  138.00 cm/s AV Vmean:          92.500 cm/s AV VTI:            0.339 m AV Peak Grad:      7.6 mmHg AV Mean Grad:      4.0 mmHg LVOT Vmax:         115.00 cm/s LVOT Vmean:        69.200 cm/s LVOT VTI:          0.285 m LVOT/AV VTI ratio: 0.84  AORTA Ao Root diam: 3.17 cm Ao Asc diam:  3.50 cm MITRAL  VALVE MV Area (PHT): 3.42 cm    SHUNTS MV Decel Time: 222 msec    Systemic VTI:  0.29 m MV E velocity: 95.40 cm/s  Systemic Diam: 1.90 cm MV A velocity: 68.10 cm/s MV E/A ratio:  1.40 Sunit Tolia Electronically signed by Olinda Bertrand Signature Date/Time: 12/04/2023/12:09:50 PM    Final    MR BRAIN WO CONTRAST Result Date: 12/03/2023 CLINICAL DATA:  Neuro deficit with stroke suspected EXAM: MRI HEAD WITHOUT CONTRAST TECHNIQUE: Multiplanar, multiecho pulse sequences of the brain and surrounding structures were obtained without intravenous contrast. COMPARISON:  Head CTA from earlier today FINDINGS: Brain: No acute infarction, hemorrhage, hydrocephalus, or collection. Rounded lesion in the right pons with hemosiderin rim and stippled bright areas by T1 and T2 weighted imaging, up to 9 mm and consistent with cavernoma. Similar features in the more superior right pons with adjacent/bilobed appearance, in total measuring up to 9 mm. Extensive chronic small vessel disease with confluent gliosis in the deep white matter and indistinct appearance in the pons. 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 Brain volume is normal. There are few scattered chronic microhemorrhages separate from the chronic blood products at the pons. Vascular: Major flow voids are preserved. Skull and upper cervical spine: Cervical spine degeneration with C3-4 mild anterolisthesis. Sinuses/Orbits: No acute finding IMPRESSION: No acute finding including infarct. Advanced chronic small vessel disease. 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 No indication of recent hemorrhage. Consider imaging follow-up for stability. Electronically Signed   By: Ronnette Coke M.D.   On: 12/03/2023 05:15   CT ANGIO HEAD NECK W WO CM Result Date: 12/03/2023 CLINICAL DATA:  Acute neurologic deficit. Left upper and lower extremity weakness EXAM: CT ANGIOGRAPHY HEAD AND NECK WITH AND  WITHOUT CONTRAST TECHNIQUE: Multidetector CT imaging of the head and neck was performed using the standard protocol during bolus administration of intravenous contrast. Multiplanar CT image reconstructions and MIPs were obtained to evaluate the vascular anatomy. Carotid stenosis measurements (when applicable) are obtained utilizing NASCET criteria, using the distal internal carotid diameter as the denominator. RADIATION DOSE REDUCTION: This exam was performed according to the departmental dose-optimization program which includes automated exposure control, adjustment of the mA and/or kV according to patient size and/or use of iterative reconstruction technique. CONTRAST:  75mL OMNIPAQUE  IOHEXOL  350 MG/ML SOLN COMPARISON:  None Available. FINDINGS: CT HEAD FINDINGS Brain: There is no mass, hemorrhage or extra-axial collection. The size and configuration of the ventricles and extra-axial CSF spaces are normal. There is hypoattenuation of the white matter, most commonly indicating chronic small vessel disease. Vascular: No hyperdense vessel or unexpected vascular calcification. Skull: The visualized skull base, calvarium and extracranial soft tissues are normal. Sinuses/Orbits: No fluid levels or advanced mucosal thickening of the visualized paranasal sinuses. No mastoid or middle ear effusion. Normal orbits. CTA NECK  FINDINGS Skeleton: No acute abnormality or high grade bony spinal canal stenosis. Other neck: Normal pharynx, larynx and major salivary glands. No cervical lymphadenopathy. Unremarkable thyroid gland. Upper chest: No pneumothorax or pleural effusion. No nodules or masses. Aortic arch: There is calcific atherosclerosis of the aortic arch. Conventional 3 vessel aortic branching pattern. RIGHT carotid system: Normal without aneurysm, dissection or stenosis. LEFT carotid system: Normal without aneurysm, dissection or stenosis. Vertebral arteries: Left dominant configuration. There is no dissection, occlusion  or flow-limiting stenosis to the skull base (V1-V3 segments). CTA HEAD FINDINGS POSTERIOR CIRCULATION: Moderate stenosis of the distal left V4 segment. No proximal occlusion of the anterior or inferior cerebellar arteries. Basilar artery is normal. Superior cerebellar arteries are normal. Posterior cerebral arteries are normal. ANTERIOR CIRCULATION: Intracranial internal carotid arteries are normal. Anterior cerebral arteries are normal. Middle cerebral arteries are normal. Venous sinuses: As permitted by contrast timing, patent. Anatomic variants: None Review of the MIP images confirms the above findings. IMPRESSION: 1. No emergent large vessel occlusion or high-grade stenosis of the intracranial arteries. 2. Chronic small vessel disease. Aortic Atherosclerosis (ICD10-I70.0). Electronically Signed   By: Juanetta Nordmann M.D.   On: 12/03/2023 01:51     PHYSICAL EXAM  Temp:  [97.3 F (36.3 C)-98.3 F (36.8 C)] 98.3 F (36.8 C) (04/29 0907) Pulse Rate:  [57-75] 64 (04/29 0845) Resp:  [9-18] 11 (04/29 0845) BP: (130-161)/(22-93) 135/83 (04/29 0845) SpO2:  [98 %-100 %] 98 % (04/29 0845) Weight:  [110 kg] 110 kg (04/29 0122)  General - Well nourished, well developed, in no apparent distress.  Ophthalmologic - fundi not visualized due to noncooperation.  Cardiovascular - Regular rhythm and rate.  Neuro - awake, alert, eyes open, orientated to age, place, time and people. No aphasia, fluent language, following all simple commands. Able to name and repeat, slight dysarthria. No gaze palsy, tracking bilaterally, visual field full, PERRL. Left eye palpebral fissure smaller than right. No facial droop. Tongue midline. Bilateral UEs 5/5, no drift. Bilaterally LEs 5/5, no drift. Sensation decreased on the left face, left FTN and HTS ataxic, gait not tested.     ASSESSMENT/PLAN Mr. Jason Meadows is a 63 y.o. male with history of hypertension, hyperlipidemia, diabetes, smoker, recent TIA admitted for  left-sided numbness and hiccup. No TNK given due to outside window.    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  CT, CTA head and neck, MRI all negative.  EF 60 to 65%, LDL 166, A1c 8.2, UDS negative.  Discharged on DAPT and Lipitor 80.  Diabetes HgbA1c 8.2 goal < 7.0 Uncontrolled CBG monitoring SSI DM education and close PCP follow up  Hypertension Stable Avoid low BP Long term BP goal normotensive  Hyperlipidemia Home meds: Lipitor 80 LDL 166, goal < 70 Now continue home Lipitor 80 Continue statin at discharge  Tobacco abuse Current smoker Smoking cessation counseling provided Nicotine  patch provided Pt is willing to quit  Hiccup  dysphagia Improved On diet Speech on board Hiccup management per primary team  Other Stroke Risk Factors Obesity, Body  mass index is 33.82 kg/m.   Other Active Problems AKI, creatinine 1.19--1.43  Hospital day # 0  I discussed with Dr. Sherrod Dolphin. I spent additional 30 minutes inpatient face-to-face time with the patient, more than 50% of which was spent in counseling and coordination of care, reviewing test results, images and medication, and discussing the diagnosis, treatment plan and potential prognosis. This patient's care requiresreview of multiple databases, neurological assessment, discussion with family, other specialists and medical decision making of high complexity.      Consuelo Denmark, MD PhD Stroke  Neurology 12/07/2023 11:55 AM    To contact Stroke Continuity provider, please refer to [email or web address]. After hours, contact General Neurology

## 2023-12-07 NOTE — ED Notes (Signed)
 CCMD called.

## 2023-12-07 NOTE — ED Provider Notes (Signed)
 [place] [facility]ERGENC[facility] DEPARTMENT AT [facility] Provider Note   CSN: [id number] Arrival date & time: 12/07/23  0112     History {Add pertinent medical, surgical, social history, OB history to HPI:1} Chief Complaint  Patient presents with   Emesis   Numbness    PEARSE KLEIBER is a 63 y.o. male.  The history is provided by the patient.  Emesis He has history of hypertension, diabetes, hyperlipidemia, TIA and comes in because of left facial numbness, hiccups, vomiting.  He had been in the hospital 3 days ago for transient ischemic attack and has been feeling a little off balance when walking since going home, but that has been stable.  At 9 AM, he noted some numbness in the left side of his face which then resolved and then recurred this evening.  At about 9 PM, he started having hiccups and vomiting.  He denies any weakness or change in his difficulty with balance.  He received ondansetron  in the ambulance coming in and has had relief of hiccups and vomiting since then.   Home Medications Prior to Admission medications   Medication Sig Start Date End Date Taking? Authorizing Provider  amLODipine (NORVASC) 5 MG tablet Take 1 tablet (5 mg total) by mouth daily. 12/04/23   Rai, Hurman Maiden, MD  aspirin  81 MG chewable tablet Chew 1 tablet (81 mg total) by mouth daily. 12/05/23   Rai, Hurman Maiden, MD  atorvastatin  (LIPITOR) 80 MG tablet Take 1 tablet (80 mg total) by mouth daily. 12/04/23   Rai, Hurman Maiden, MD  clopidogrel  (PLAVIX ) 75 MG tablet Take 1 tablet (75 mg total) by mouth daily for 21 days. 12/05/23 12/26/23  Rai, Hurman Maiden, MD  lisinopril-hydrochlorothiazide (ZESTORETIC) 10-12.5 MG tablet Take 1 tablet by mouth daily. 12/04/23   Rai, Hurman Maiden, MD  metFORMIN  (GLUCOPHAGE ) 1000 MG tablet Take 1 tablet (1,000 mg total) by mouth 2 (two) times daily with a meal. 12/04/23   Rai, Ripudeep K, MD  nicotine  (NICODERM CQ  - DOSED IN MG/24 HOURS) 21 mg/24hr patch Place 1 patch (21 mg total)  onto the skin daily. 12/05/23   Loma Rising, MD      Allergies    Patient has no known allergies.    Review of Systems   Review of Systems  Gastrointestinal:  Positive for vomiting.  All other systems reviewed and are negative.   Physical Exam Updated Vital Signs BP (!) 144/93   Pulse 71   Temp 98.1 F (36.7 C) (Oral)   Resp 13   Ht 5\' 11"  (1.803 m)   Wt 110 kg   SpO2 100%   BMI 33.82 kg/m  Physical Exam Vitals and nursing note reviewed.   63 year old male, resting comfortably and in no acute distress. Vital signs are significant for elevated blood pressure. Oxygen saturation is 100%, which is normal. Head is normocephalic and atraumatic. PERRLA, EOMI. Oropharynx is clear. Neck is nontender and supple without adenopathy or JVD.  There are no carotid bruits. Lungs are clear without rales, wheezes, or rhonchi. Chest is nontender. Heart has regular rate and rhythm without murmur. Abdomen is soft, flat, nontender. Extremities have no cyanosis or edema, full range of motion is present. Skin is warm and dry without rash. Neurologic: Awake and alert, oriented x 3.  Mental status is normal, speech is normal.  He has decreased sensation on the left side of his face including all 3 divisions of the trigeminal nerve.  There is no  facial droop.  Tongue protrudes in the midline.  Soft palate moves symmetrically, shrug is normal.  Strength is 5/5 in all 4 extremities.  There is no pronator drift.  Sensation is normal in all 4 extremities.  There is no extinction on double simultaneous stimulation.  Finger-nose testing is normal.  ED Results / Procedures / Treatments   Labs (all labs ordered are listed, but only abnormal results are displayed) Labs Reviewed  CBC - Abnormal; Notable for the following components:      Result Value   RBC 5.93 (*)    Hemoglobin 17.1 (*)    All other components within normal limits  COMPREHENSIVE METABOLIC PANEL WITH GFR - Abnormal; Notable for the  following components:   Glucose, Bld 137 (*)    Creatinine, Ser 1.43 (*)    Total Bilirubin 1.3 (*)    GFR, Estimated 55 (*)    All other components within normal limits  PROTIME-INR  APTT  DIFFERENTIAL  ETHANOL  RAPID URINE DRUG SCREEN, HOSP PERFORMED    EKG EKG Interpretation Date/Time:  Tuesday December 07 2023 01:18:05 EDT Ventricular Rate:  63 PR Interval:  143 QRS Duration:  103 QT Interval:  408 QTC Calculation: 418 R Axis:   58  Text Interpretation: duplicate, doscard Confirmed by Alissa April ([id number]) on 12/07/2023 1:27:40 AM  Radiology MR Brain W and Wo Contrast Result Date: 12/07/2023 CLINICAL DATA:  Initial evaluation for acute neuro deficit, stroke suspected. EXAM: MRI HEAD WITHOUT AND WITH CONTRAST TECHNIQUE: Multiplanar, multiecho pulse sequences of the brain and surrounding structures were obtained without and with intravenous contrast. CONTRAST:  10mL GADAVIST GADOBUTROL 1 MMOL/ML IV SOLN COMPARISON:  Prior study from 12/03/2023. FINDINGS: Brain: Cerebral volume within normal limits for age. Patchy T2/FLAIR hyperintensity involving the periventricular and deep white matter both cerebral hemispheres as well as the pons, consistent with chronic small vessel ischemic disease, fairly advanced in nature. Remote right basal ganglia lacunar infarcts noted. 8 mm acute ischemic nonhemorrhagic infarcts seen involving the left lateral medulla (series 5, image 59). No significant mass effect. No other evidence for acute or subacute ischemia. Previously identified 2 small cavernomas measuring up to approximately 9 mm seen involving the right pons, stable. No evidence for recent or interval hemorrhage. No other acute intracranial hemorrhage. Multiple additional scattered chronic micro hemorrhages noted elsewhere, likely hypertensive/small vessel related. No other mass lesion, mass effect or midline shift. No hydrocephalus or extra-axial fluid collection. Pituitary gland within normal limits.  No other abnormal enhancement. Vascular: Major intracranial vascular flow voids are maintained. Skull and upper cervical spine: Craniocervical junction within normal limits. Bone marrow signal intensity within normal limits. No scalp soft tissue abnormality. Sinuses/Orbits: Globes orbital soft tissues within normal limits. Paranasal sinuses are clear. Trace bilateral mastoid effusions, of doubtful significance. Image nasopharynx unremarkable. Other: None. IMPRESSION: 1. 8 mm acute ischemic nonhemorrhagic left lateral medullary infarct. 2. Underlying advanced chronic microvascular ischemic disease with remote right basal ganglia lacunar infarcts. 3. 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 No evidence for recent or interval hemorrhage. Electronically Signed   By: Virgia Griffins M.D.   On: 12/07/2023 03:31    Procedures Procedures  Cardiac monitor shows normal sinus rhythm, per my interpretation.  Medications Ordered in ED Medications  chlorproMAZINE (THORAZINE) tablet 25 mg (25 mg Oral Given 12/07/23 0155)  gadobutrol (GADAVIST) 1 MMOL/ML injection 10 mL (10 mLs Intravenous Contrast Given 12/07/23 0234)  prochlorperazine (COMPAZINE) injection 10 mg (10 mg Intravenous Given 12/07/23 0317)  ED Course/ Medical Decision Making/ A&P   {   Click here for ABCD2, HEART and other calculatorsREFRESH Note before signing :1}                              Medical Decision Making Amount and/or Complexity of Data Reviewed Labs: ordered. Radiology: ordered.  Risk Prescription drug management. Decision regarding hospitalization.   Numbness in the left side of the face which may represent TIA versus small stroke.  Hiccups and nausea which have resolved.  I have reviewed his past records and note hospitalization 12/02/2023-12/04/2023 for suspected stroke but with negative MRI and incidental finding of 2 lesions in the right side of the pons consistent with cavernoma.  Presentation at that time  was different as he had left-sided weakness.  Code stroke is not activated because of minimal neurologic findings.  Given recent MRI showing cavernoma, I have ordered repeat head MRI with and without contrast.  Hiccups of recurred, I have ordered a dose of chlorpromazine.  I have reviewed his laboratory test, my interpretation is renal insufficiency with significant change compared with 3 days ago, although similar to value from 12/03/2023 mildly elevated total bilirubin, polycythemia which is new compared with 12/03/2023.  MRI shows 8 mm acute ischemic nonhemorrhagic infarct in the left lateral medulla.  Have independently viewed the images, and agree with the radiologist's interpretation.  I have discussed case with Dr. Bonnita Buttner of neurology service.  Patient has already had complete workup regarding stroke, but he does have new renal insufficiency and polycythemia so I am electing to admit him anyway.  I have discussed the case with Dr. Aaron Aas of [facility], who agrees to admit the patient.  CRITICAL CARE Performed by: Alissa April Total critical care time: 45 minutes Critical care time was exclusive of separately billable procedures and treating other patients. Critical care was necessary to treat or prevent imminent or life-threatening deterioration. Critical care was time spent personally by me on the following activities: development of treatment plan with patient and/or surrogate as well as nursing, discussions with consultants, evaluation of patient's response to treatment, examination of patient, obtaining history from patient or surrogate, ordering and performing treatments and interventions, ordering and review of laboratory studies, ordering and review of radiographic studies, pulse oximetry and re-evaluation of patient's condition.  Final Clinical Impression(s) / ED Diagnoses Final diagnoses:  Cerebrovascular accident (CVA), unspecified mechanism (HCC)  Renal insufficiency  Elevated  random blood glucose level  Serum total bilirubin elevated  Polycythemia    Rx / DC Orders ED Discharge Orders     None
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
## 2023-12-08 ENCOUNTER — Other Ambulatory Visit (HOSPITAL_COMMUNITY): Payer: Self-pay

## 2023-12-08 DIAGNOSIS — I639 Cerebral infarction, unspecified: Secondary | ICD-10-CM | POA: Diagnosis not present

## 2023-12-08 DIAGNOSIS — E785 Hyperlipidemia, unspecified: Secondary | ICD-10-CM | POA: Diagnosis not present

## 2023-12-08 DIAGNOSIS — E1151 Type 2 diabetes mellitus with diabetic peripheral angiopathy without gangrene: Secondary | ICD-10-CM | POA: Diagnosis not present

## 2023-12-08 DIAGNOSIS — F17210 Nicotine dependence, cigarettes, uncomplicated: Secondary | ICD-10-CM | POA: Diagnosis not present

## 2023-12-08 DIAGNOSIS — Z794 Long term (current) use of insulin: Secondary | ICD-10-CM

## 2023-12-08 DIAGNOSIS — R066 Hiccough: Secondary | ICD-10-CM

## 2023-12-08 LAB — BASIC METABOLIC PANEL WITH GFR
Anion gap: 11 (ref 5–15)
BUN: 20 mg/dL (ref 8–23)
CO2: 24 mmol/L (ref 22–32)
Calcium: 8.4 mg/dL — ABNORMAL LOW (ref 8.9–10.3)
Chloride: 101 mmol/L (ref 98–111)
Creatinine, Ser: 1.33 mg/dL — ABNORMAL HIGH (ref 0.61–1.24)
GFR, Estimated: >60 mL/min (ref >60)
Glucose, Bld: 118 mg/dL — ABNORMAL HIGH (ref 70–99)
Potassium: 3.7 mmol/L (ref 3.5–5.1)
Sodium: 136 mmol/L (ref 135–145)

## 2023-12-08 LAB — CBC
HCT: 43 % (ref 39.0–52.0)
Hemoglobin: 14.6 g/dL (ref 13.0–17.0)
MCH: 29.3 pg (ref 26.0–34.0)
MCHC: 34 g/dL (ref 30.0–36.0)
MCV: 86.2 fL (ref 80.0–100.0)
Platelets: 203 10*3/uL (ref 150–400)
RBC: 4.99 MIL/uL (ref 4.22–5.81)
RDW: 14.2 % (ref 11.5–15.5)
WBC: 7.1 10*3/uL (ref 4.0–10.5)
nRBC: 0 % (ref 0.0–0.2)

## 2023-12-08 LAB — GLUCOSE, CAPILLARY
Glucose-Capillary: 146 mg/dL — ABNORMAL HIGH (ref 70–99)
Glucose-Capillary: 171 mg/dL — ABNORMAL HIGH (ref 70–99)
Glucose-Capillary: 169 mg/dL — ABNORMAL HIGH (ref 70–99)

## 2023-12-08 MED ORDER — PANTOPRAZOLE SODIUM 40 MG PO TBEC
40.0000 mg | DELAYED_RELEASE_TABLET | Freq: Every day | ORAL | 0 refills | Status: AC
  Filled 2023-12-08: qty 30, 30d supply, fill #0

## 2023-12-08 MED ORDER — CLOPIDOGREL BISULFATE 75 MG PO TABS
75.0000 mg | ORAL_TABLET | Freq: Every day | ORAL | 0 refills | Status: AC
  Filled 2023-12-08: qty 4, 4d supply, fill #0

## 2023-12-08 MED ORDER — PANTOPRAZOLE SODIUM 40 MG PO TBEC: 40.0000 mg | DELAYED_RELEASE_TABLET | Freq: Every day | ORAL | Status: DC

## 2023-12-08 MED ORDER — BACLOFEN 5 MG PO TABS
5.0000 mg | ORAL_TABLET | Freq: Three times a day (TID) | ORAL | 0 refills | Status: DC | PRN
  Filled 2023-12-08: qty 15, 5d supply, fill #0

## 2023-12-08 MED ORDER — BACLOFEN 10 MG PO TABS: 5.0000 mg | ORAL_TABLET | Freq: Three times a day (TID) | ORAL | Status: DC | PRN
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
## 2023-12-08 NOTE — Progress Notes (Signed)
   Inpatient Rehabilitation Admissions Coordinator   I met with patient and spouse with OT. We discussed the benefits of CIR level Rehab vs OP as well as cost of care for both. He is doing well and I recommend OP therapy follow up . They are in agreement. I will alert acute team and TOC. Wife states she can provide 24/7 assist. We will sign off.  Jeannetta Millman, RN, MSN Rehab Admissions Coordinator [phone] 12/08/2023 3:26 PM
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
## 2023-12-08 NOTE — Progress Notes (Signed)
  Inpatient Rehab Admissions Coordinator :  Per therapy recommendations, patient was screened for CIR candidacy by Ottie Glazier RN MSN.  At this time patient appears to be a potential candidate for CIR. I will place a rehab consult per protocol for full assessment. Please call me with any questions.  Ottie Glazier RN MSN Admissions Coordinator [phone]

## 2023-12-08 NOTE — TOC Initial Note (Signed)
 Transition of Care [facility]) - Initial/Assessment Note    Patient Details  Name: Jason Meadows MRN: [id number] Date of Birth: 28-Nov-1960  Transition of Care [facility]) CM/SW Contact:    Jonathan Neighbor, RN Phone Number: 12/08/2023, 3:16 PM  Clinical Narrative:                  Pt is from home with spouse. She is able to provide support at home.  Pt has transportation as needed.  Pt manages his own medications and denies any issues. Recommendations are for CIR. Awaiting decision. TOC following.  Expected Discharge Plan: IP Rehab Facility Barriers to Discharge: Continued Medical Work up   Patient Goals and CMS Choice   CMS Medicare.gov Compare Post Acute Care list provided to:: Patient Choice offered to / list presented to : Patient, Spouse      Expected Discharge Plan and Services   Discharge Planning Services: CM Consult Post Acute Care Choice: IP Rehab Living arrangements for the past 2 months: Single Family Home                                      Prior Living Arrangements/Services Living arrangements for the past 2 months: Single Family Home Lives with:: Spouse Patient language and need for interpreter reviewed:: Yes Do you feel safe going back to the place where you live?: Yes        Care giver support system in place?: Yes (comment) Current home services: DME (walker/ cane) Criminal Activity/Legal Involvement Pertinent to Current Situation/Hospitalization: No - Comment as needed  Activities of Daily Living   ADL Screening (condition at time of admission) Independently performs ADLs?: No Does the patient have a NEW difficulty with bathing/dressing/toileting/self-feeding that is expected to last >3 days?: Yes (Initiates electronic notice to provider for possible OT consult) (needs help showering now) Does the patient have a NEW difficulty with getting in/out of bed, walking, or climbing stairs that is expected to last >3 days?: No Does the patient have a NEW  difficulty with communication that is expected to last >3 days?: No Is the patient deaf or have difficulty hearing?: Yes (uses hearing aids. not at bedside currently.) Does the patient have difficulty seeing, even when wearing glasses/contacts?: Yes (uses reading glasses) Does the patient have difficulty concentrating, remembering, or making decisions?: No  Permission Sought/Granted                  Emotional Assessment Appearance:: Appears stated age Attitude/Demeanor/Rapport: Engaged Affect (typically observed): Accepting Orientation: : Oriented to Self, Oriented to Place, Oriented to  Time, Oriented to Situation   Psych Involvement: No (comment)  Admission diagnosis:  Polycythemia [D75.1] Renal insufficiency [N28.9] Serum total bilirubin elevated [R17] Acute ischemic stroke (HCC) [I63.9] Elevated random blood glucose level [R73.9] Cerebrovascular accident (CVA), unspecified mechanism (HCC) [I63.9] Patient Active Problem List   Diagnosis Date Noted   Acute ischemic stroke (HCC) 12/07/2023   Stroke-like symptom 12/03/2023   Non-insulin  dependent type 2 diabetes mellitus (HCC) 12/03/2023   Essential hypertension 12/03/2023   Hyperlipidemia 12/03/2023   Continuous dependence on cigarette smoking 12/03/2023   AKI (acute kidney injury) (HCC) 12/03/2023   PCP:  Lutricia Salts, PA-C Pharmacy:   [facility] [address] [place] [address] Phone: [phone] Fax: [phone]     Social Drivers of Health (SDOH) Social History: SDOH Screenings   Food Insecurity: No  Food Insecurity (12/07/2023)  Housing: Low Risk  (12/07/2023)  Transportation Needs: No Transportation Needs (12/07/2023)  Utilities: Not At Risk (12/07/2023)  Tobacco Use: High Risk (12/07/2023)   SDOH Interventions:     Readmission Risk Interventions     No data to display

## 2023-12-08 NOTE — TOC Transition Note (Signed)
 Transition of Care [facility]) - Discharge Note   Patient Details  Name: Jason Meadows MRN: [id number] Date of Birth: 11-Mar-1961  Transition of Care [facility]) CM/SW Contact:  Jonathan Neighbor, RN Phone Number: 12/08/2023, 3:54 PM   Clinical Narrative:     Pt has decided on home with outpatient therapy. Outpatient arranged with [facility]. Information on the AVS.  No new DME needs.  Wife to provide transportation home.  Final next level of care: OP Rehab Barriers to Discharge: No Barriers Identified   Patient Goals and CMS Choice   CMS Medicare.gov Compare Post Acute Care list provided to:: Patient Choice offered to / list presented to : Patient, Spouse      Discharge Placement                       Discharge Plan and Services Additional resources added to the After Visit Summary for     Discharge Planning Services: CM Consult Post Acute Care Choice: IP Rehab                               Social Drivers of Health (SDOH) Interventions SDOH Screenings   Food Insecurity: No Food Insecurity (12/07/2023)  Housing: Low Risk  (12/07/2023)  Transportation Needs: No Transportation Needs (12/07/2023)  Utilities: Not At Risk (12/07/2023)  Tobacco Use: High Risk (12/07/2023)     Readmission Risk Interventions     No data to display
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
## 2023-12-08 NOTE — Progress Notes (Signed)
 STROKE TEAM PROGRESS NOTE   SUBJECTIVE (INTERVAL HISTORY) His wife is at the bedside.  Overall his condition is improving, hiccup has improved. Left eye palpebral fissure now normalized and his left facial numbness also better. Passed swallow on diet.    OBJECTIVE Temp:  [97.7 F (36.5 C)-99 F (37.2 C)] 98.1 F (36.7 C) (04/30 1550) Pulse Rate:  [65-76] 72 (04/30 1550) Cardiac Rhythm: Normal sinus rhythm (04/30 0700) Resp:  [20] 20 (04/30 1550) BP: (104-142)/(61-81) 142/80 (04/30 1550) SpO2:  [96 %-99 %] 96 % (04/30 1550)  Recent Labs  Lab 12/04/23 1109 12/07/23 1629 12/07/23 2125 12/08/23 0615 12/08/23 1125  GLUCAP 179* 172* 188* 146* 171*   Recent Labs  Lab 12/02/23 2344 12/03/23 0010 12/03/23 0049 12/03/23 0355 12/04/23 1042 12/07/23 0355 12/08/23 0625  NA 138   < > 139 136 136 139 136  K 3.5   < > 4.6 3.5 3.7 3.8 3.7  CL 102   < > 101 101 103 100 101  CO2 26  --   --  24 24 26 24   GLUCOSE 180*   < > 176* 145* 198* 137* 118*  BUN 9   < > 11 10 11 21 20   CREATININE 1.32*   < > 1.40* 1.27* 1.19 1.43* 1.33*  CALCIUM  9.0  --   --  8.8* 9.0 10.1 8.4*  PHOS  --   --   --   --  2.5  --   --    < > = values in this interval not displayed.   Recent Labs  Lab 12/02/23 2344 12/04/23 1042 12/07/23 0355  AST 24  --  23  ALT 20  --  23  ALKPHOS 82  --  81  BILITOT 0.8  --  1.3*  PROT 6.7  --  7.3  ALBUMIN 3.7 3.5 4.1   Recent Labs  Lab 12/02/23 2344 12/03/23 0010 12/03/23 0049 12/03/23 0355 12/07/23 0355 12/08/23 0625  WBC 6.9  --   --  6.4 10.3 7.1  NEUTROABS 4.0  --   --   --  7.6  --   HGB 14.9 15.3 15.6 14.4 17.1* 14.6  HCT 44.4 45.0 46.0 43.8 51.3 43.0  MCV 86.7  --   --  86.7 86.5 86.2  PLT 200  --   --  182 214 203   No results for input(s): "CKTOTAL", "CKMB", "CKMBINDEX", "TROPONINI" in the last 168 hours. Recent Labs    12/07/23 0355  LABPROT 14.4  INR 1.1   No results for input(s): "COLORURINE", "LABSPEC", "PHURINE", "GLUCOSEU", "HGBUR",  "BILIRUBINUR", "KETONESUR", "PROTEINUR", "UROBILINOGEN", "NITRITE", "LEUKOCYTESUR" in the last 72 hours.  Invalid input(s): "APPERANCEUR"     Component Value Date/Time   CHOL 209 (H) 12/03/2023 0355   TRIG 127 12/03/2023 0355   HDL 18 (L) 12/03/2023 0355   CHOLHDL 11.6 12/03/2023 0355   VLDL 25 12/03/2023 0355   LDLCALC 166 (H) 12/03/2023 0355   Lab Results  Component Value Date   HGBA1C 8.2 (H) 12/04/2023      Component Value Date/Time   LABOPIA NONE DETECTED 12/04/2023 0956   COCAINSCRNUR NONE DETECTED 12/04/2023 0956   LABBENZ NONE DETECTED 12/04/2023 0956   AMPHETMU NONE DETECTED 12/04/2023 0956   THCU NONE DETECTED 12/04/2023 0956   LABBARB NONE DETECTED 12/04/2023 0956    Recent Labs  Lab 12/02/23 2344 12/07/23 0355  ETH <15 <15    I have personally reviewed the radiological images below and agree with the radiology  interpretations.  MR Brain W and Wo Contrast Result Date: 12/07/2023 CLINICAL DATA:  Initial evaluation for acute neuro deficit, stroke suspected. EXAM: MRI HEAD WITHOUT AND WITH CONTRAST TECHNIQUE: Multiplanar, multiecho pulse sequences of the brain and surrounding structures were obtained without and with intravenous contrast. CONTRAST:  10mL GADAVIST GADOBUTROL 1 MMOL/ML IV SOLN COMPARISON:  Prior study from 12/03/2023. FINDINGS: Brain: Cerebral volume within normal limits for age. Patchy T2/FLAIR hyperintensity involving the periventricular and deep white matter both cerebral hemispheres as well as the pons, consistent with chronic small vessel ischemic disease, fairly advanced in nature. Remote right basal ganglia lacunar infarcts noted. 8 mm acute ischemic nonhemorrhagic infarcts seen involving the left lateral medulla (series 5, image 59). No significant mass effect. No other evidence for acute or subacute ischemia. Previously identified 2 small cavernomas measuring up to approximately 9 mm seen involving the right pons, stable. No evidence for recent or  interval hemorrhage. No other acute intracranial hemorrhage. Multiple additional scattered chronic micro hemorrhages noted elsewhere, likely hypertensive/small vessel related. No other mass lesion, mass effect or midline shift. No hydrocephalus or extra-axial fluid collection. Pituitary gland within normal limits. No other abnormal enhancement. Vascular: Major intracranial vascular flow voids are maintained. Skull and upper cervical spine: Craniocervical junction within normal limits. Bone marrow signal intensity within normal limits. No scalp soft tissue abnormality. Sinuses/Orbits: Globes orbital soft tissues within normal limits. Paranasal sinuses are clear. Trace bilateral mastoid effusions, of doubtful significance. Image nasopharynx unremarkable. Other: None. IMPRESSION: 1. 8 mm acute ischemic nonhemorrhagic left lateral medullary infarct. 2. Underlying advanced chronic microvascular ischemic disease with remote right basal ganglia lacunar infarcts. 3. 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 No evidence for recent or interval hemorrhage. Electronically Signed   By: Virgia Griffins M.D.   On: 12/07/2023 03:31   ECHOCARDIOGRAM COMPLETE Result Date: 12/04/2023    ECHOCARDIOGRAM REPORT   Patient Name:   Jason Meadows Date of Exam: 12/04/2023 Medical Rec #:  [id number]        Height:       71.0 in Accession #:    [id number]       Weight:       243.0 lb Date of Birth:  03/26/1961         BSA:          2.291 m Patient Age:    63 years         BP:           174/86 mmHg Patient Gender: M                HR:           54 bpm. Exam Location:  Inpatient Procedure: 2D Echo, Color Doppler and Cardiac Doppler (Both Spectral and Color            Flow Doppler were utilized during procedure). Indications:    Stroke  History:        Patient has no prior history of Echocardiogram examinations.                 Risk Factors:Hypertension and Diabetes.  Sonographer:    Andrena Bang Referring Phys: Roxan Copes  IMPRESSIONS  1. Left ventricular ejection fraction, by estimation, is 60 to 65%. The left ventricle has normal function. The left ventricle has no regional wall motion abnormalities. There is mild left ventricular hypertrophy. Left ventricular diastolic parameters were normal.  2. Right ventricular systolic function is normal. The right ventricular size is  normal. Tricuspid regurgitation signal is inadequate for assessing PA pressure.  3. The mitral valve is normal in structure. No evidence of mitral valve regurgitation. No evidence of mitral stenosis.  4. The aortic valve is tricuspid. Aortic valve regurgitation is not visualized. Aortic valve sclerosis is present, with no evidence of aortic valve stenosis.  5. The inferior vena cava is normal in size with <50% respiratory variability, suggesting right atrial pressure of 8 mmHg. Conclusion(s)/Recommendation(s): No intracardiac source of embolism detected on this transthoracic study. Consider a transesophageal echocardiogram to exclude cardiac source of embolism if clinically indicated. FINDINGS  Left Ventricle: Left ventricular ejection fraction, by estimation, is 60 to 65%. The left ventricle has normal function. The left ventricle has no regional wall motion abnormalities. The left ventricular internal cavity size was normal in size. There is  mild left ventricular hypertrophy. Left ventricular diastolic parameters were normal. Right Ventricle: The right ventricular size is normal. No increase in right ventricular wall thickness. Right ventricular systolic function is normal. Tricuspid regurgitation signal is inadequate for assessing PA pressure. Left Atrium: Left atrial size was normal in size. Right Atrium: Right atrial size was normal in size. Pericardium: There is no evidence of pericardial effusion. Mitral Valve: The mitral valve is normal in structure. No evidence of mitral valve regurgitation. No evidence of mitral valve stenosis. Tricuspid Valve: The  tricuspid valve is normal in structure. Tricuspid valve regurgitation is not demonstrated. No evidence of tricuspid stenosis. Aortic Valve: The aortic valve is tricuspid. Aortic valve regurgitation is not visualized. Aortic valve sclerosis is present, with no evidence of aortic valve stenosis. Aortic valve mean gradient measures 4.0 mmHg. Aortic valve peak gradient measures 7.6  mmHg. Aortic valve area, by VTI measures 2.38 cm. Pulmonic Valve: The pulmonic valve was not well visualized. Pulmonic valve regurgitation is not visualized. No evidence of pulmonic stenosis. Aorta: The aortic root and ascending aorta are structurally normal, with no evidence of dilitation. Venous: The inferior vena cava is normal in size with less than 50% respiratory variability, suggesting right atrial pressure of 8 mmHg. IAS/Shunts: The interatrial septum was not well visualized.  LEFT VENTRICLE PLAX 2D LVIDd:         4.20 cm      Diastology LVIDs:         3.10 cm      LV e' medial:    8.16 cm/s LV PW:         0.97 cm      LV E/e' medial:  11.7 LV IVS:        1.20 cm      LV e' lateral:   11.10 cm/s LVOT diam:     1.90 cm      LV E/e' lateral: 8.6 LV SV:         81 LV SV Index:   35 LVOT Area:     2.84 cm  LV Volumes (MOD) LV vol d, MOD A2C: 123.0 ml LV vol d, MOD A4C: 115.0 ml LV vol s, MOD A2C: 40.0 ml LV vol s, MOD A4C: 40.9 ml LV SV MOD A2C:     83.0 ml LV SV MOD A4C:     115.0 ml LV SV MOD BP:      80.5 ml RIGHT VENTRICLE RV S prime:     12.00 cm/s TAPSE (M-mode): 2.1 cm LEFT ATRIUM           Index LA diam:      3.70 cm 1.62 cm/m LA Vol (  A2C): 62.7 ml 27.37 ml/m LA Vol (A4C): 20.0 ml 8.73 ml/m  AORTIC VALVE AV Area (Vmax):    2.36 cm AV Area (Vmean):   2.12 cm AV Area (VTI):     2.38 cm AV Vmax:           138.00 cm/s AV Vmean:          92.500 cm/s AV VTI:            0.339 m AV Peak Grad:      7.6 mmHg AV Mean Grad:      4.0 mmHg LVOT Vmax:         115.00 cm/s LVOT Vmean:        69.200 cm/s LVOT VTI:          0.285 m LVOT/AV  VTI ratio: 0.84  AORTA Ao Root diam: 3.17 cm Ao Asc diam:  3.50 cm MITRAL VALVE MV Area (PHT): 3.42 cm    SHUNTS MV Decel Time: 222 msec    Systemic VTI:  0.29 m MV E velocity: 95.40 cm/s  Systemic Diam: 1.90 cm MV A velocity: 68.10 cm/s MV E/A ratio:  1.40 Sunit Tolia Electronically signed by Olinda Bertrand Signature Date/Time: 12/04/2023/12:09:50 PM    Final    MR BRAIN WO CONTRAST Result Date: 12/03/2023 CLINICAL DATA:  Neuro deficit with stroke suspected EXAM: MRI HEAD WITHOUT CONTRAST TECHNIQUE: Multiplanar, multiecho pulse sequences of the brain and surrounding structures were obtained without intravenous contrast. COMPARISON:  Head CTA from earlier today FINDINGS: Brain: No acute infarction, hemorrhage, hydrocephalus, or collection. Rounded lesion in the right pons with hemosiderin rim and stippled bright areas by T1 and T2 weighted imaging, up to 9 mm and consistent with cavernoma. Similar features in the more superior right pons with adjacent/bilobed appearance, in total measuring up to 9 mm. Extensive chronic small vessel disease with confluent gliosis in the deep white matter and indistinct appearance in the pons. 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 Brain volume is normal. There are few scattered chronic microhemorrhages separate from the chronic blood products at the pons. Vascular: Major flow voids are preserved. Skull and upper cervical spine: Cervical spine degeneration with C3-4 mild anterolisthesis. Sinuses/Orbits: No acute finding IMPRESSION: No acute finding including infarct. Advanced chronic small vessel disease. 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 No indication of recent hemorrhage. Consider imaging follow-up for stability. Electronically Signed   By: Ronnette Coke M.D.   On: 12/03/2023 05:15   CT ANGIO HEAD NECK W WO CM Result Date: 12/03/2023 CLINICAL DATA:  Acute neurologic deficit. Left upper and  lower extremity weakness EXAM: CT ANGIOGRAPHY HEAD AND NECK WITH AND WITHOUT CONTRAST TECHNIQUE: Multidetector CT imaging of the head and neck was performed using the standard protocol during bolus administration of intravenous contrast. Multiplanar CT image reconstructions and MIPs were obtained to evaluate the vascular anatomy. Carotid stenosis measurements (when applicable) are obtained utilizing NASCET criteria, using the distal internal carotid diameter as the denominator. RADIATION DOSE REDUCTION: This exam was performed according to the departmental dose-optimization program which includes automated exposure control, adjustment of the mA and/or kV according to patient size and/or use of iterative reconstruction technique. CONTRAST:  75mL OMNIPAQUE  IOHEXOL  350 MG/ML SOLN COMPARISON:  None Available. FINDINGS: CT HEAD FINDINGS Brain: There is no mass, hemorrhage or extra-axial collection. The size and configuration of the ventricles and extra-axial CSF spaces are normal. There is hypoattenuation of the white  matter, most commonly indicating chronic small vessel disease. Vascular: No hyperdense vessel or unexpected vascular calcification. Skull: The visualized skull base, calvarium and extracranial soft tissues are normal. Sinuses/Orbits: No fluid levels or advanced mucosal thickening of the visualized paranasal sinuses. No mastoid or middle ear effusion. Normal orbits. CTA NECK FINDINGS Skeleton: No acute abnormality or high grade bony spinal canal stenosis. Other neck: Normal pharynx, larynx and major salivary glands. No cervical lymphadenopathy. Unremarkable thyroid gland. Upper chest: No pneumothorax or pleural effusion. No nodules or masses. Aortic arch: There is calcific atherosclerosis of the aortic arch. Conventional 3 vessel aortic branching pattern. RIGHT carotid system: Normal without aneurysm, dissection or stenosis. LEFT carotid system: Normal without aneurysm, dissection or stenosis. Vertebral  arteries: Left dominant configuration. There is no dissection, occlusion or flow-limiting stenosis to the skull base (V1-V3 segments). CTA HEAD FINDINGS POSTERIOR CIRCULATION: Moderate stenosis of the distal left V4 segment. No proximal occlusion of the anterior or inferior cerebellar arteries. Basilar artery is normal. Superior cerebellar arteries are normal. Posterior cerebral arteries are normal. ANTERIOR CIRCULATION: Intracranial internal carotid arteries are normal. Anterior cerebral arteries are normal. Middle cerebral arteries are normal. Venous sinuses: As permitted by contrast timing, patent. Anatomic variants: None Review of the MIP images confirms the above findings. IMPRESSION: 1. No emergent large vessel occlusion or high-grade stenosis of the intracranial arteries. 2. Chronic small vessel disease. Aortic Atherosclerosis (ICD10-I70.0). Electronically Signed   By: Juanetta Nordmann M.D.   On: 12/03/2023 01:51     PHYSICAL EXAM  Temp:  [97.7 F (36.5 C)-99 F (37.2 C)] 98.1 F (36.7 C) (04/30 1550) Pulse Rate:  [65-76] 72 (04/30 1550) Resp:  [20] 20 (04/30 1550) BP: (104-142)/(61-81) 142/80 (04/30 1550) SpO2:  [96 %-99 %] 96 % (04/30 1550)  General - Well nourished, well developed, in no apparent distress.  Ophthalmologic - fundi not visualized due to noncooperation.  Cardiovascular - Regular rhythm and rate.  Neuro - awake, alert, eyes open, orientated to age, place, time and people. No aphasia, fluent language, following all simple commands. Able to name and repeat, slight dysarthria. No gaze palsy, tracking bilaterally, visual field full, PERRL. Left eye palpebral fissure smaller than right. No facial droop. Tongue midline. Bilateral UEs 5/5, no drift. Bilaterally LEs 5/5, no drift. Sensation decreased on the left face, left FTN and HTS ataxic, gait not tested.     ASSESSMENT/PLAN Mr. Jason Meadows is a 63 y.o. male with history of hypertension, hyperlipidemia, diabetes,  smoker, recent TIA admitted for left-sided numbness and hiccup. No TNK given due to outside window.    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 HH Disposition: Pending  Recent TIA Admitted 12/03/2023 for left-sided weakness.  CT, CTA head and neck, MRI all negative.  EF 60 to 65%, LDL 166, A1c 8.2, UDS negative.  Discharged on DAPT and Lipitor 80.  Diabetes HgbA1c 8.2 goal < 7.0 Uncontrolled CBG monitoring SSI DM education and close PCP follow up  Hypertension Stable Avoid low BP Long term BP goal normotensive  Hyperlipidemia Home meds: Lipitor 80 LDL 166, goal < 70 Now continue home  Lipitor 80 Continue statin at discharge  Tobacco abuse Current smoker Smoking cessation counseling provided Nicotine  patch provided Pt is willing to quit  Hiccup Improved On diet, put on baclofen  Speech on board Hiccup management per primary team  Other Stroke Risk Factors Obesity, Body mass index is 31.46 kg/m.   Other Active Problems AKI, creatinine 1.19--1.43--1.33  Hospital day # 0  Neurology will sign off. Please call with questions. Pt will follow up with stroke clinic NP at [facility] in about 4 weeks. Thanks for the consult.   Consuelo Denmark, MD PhD Stroke Neurology 12/08/2023 4:18 PM    To contact Stroke Continuity provider, please refer to [email or web address]. After hours, contact General Neurology

## 2023-12-08 NOTE — Care Management Obs Status (Signed)
 MEDICARE OBSERVATION STATUS NOTIFICATION   Patient Details  Name: Jason Meadows MRN: [id number] Date of Birth: Jun 24, 1961   Medicare Observation Status Notification Given:  Yes    Jonathan Neighbor, RN 12/08/2023, 3:14 PM

## 2023-12-13 ENCOUNTER — Telehealth: Payer: Self-pay | Admitting: Neurology

## 2023-12-13 NOTE — Telephone Encounter (Signed)
 I received a call from primary care seeing patient. Has had hiccups since inpatient for stroke. Had lateral medullary infarct which can be a cause of hiccups. PCP stating that he is hiccuping 50x a minute. I advised to go to the emergency room especially because it must be difficult to breathe and patient is likely in distress if hiccuping 50x a minute, pcp stated patient is uncomfortable but no problems breathing and he does not want to go back to the emergency room. He has been on baclofen  5mg  tid and going to 10mg  is unlikely to be much more helpful. Recommended trying Chlorpromazine  since the hiccups are severe, she will discuss side effects with patient and ensure no containdications to using this medication. I also advised to ensure he does not have long QT, discontinue to baclofen  as Chlorpromazine  can also cause sedation. Start him at 25mg  tid and can increase to 50mg  tid as needed and tolerated. Discussed with Odean Bend as pcp requested we see him as soon as we can within the next few days if possible; Dr. Salli Crawley has an availability Friday per Odean Bend this is the next available provider opening. PCP and patient were very appreciative.

## 2023-12-14 ENCOUNTER — Encounter (HOSPITAL_COMMUNITY): Payer: Self-pay | Admitting: *Deleted

## 2023-12-14 ENCOUNTER — Other Ambulatory Visit: Payer: Self-pay

## 2023-12-14 ENCOUNTER — Emergency Department (HOSPITAL_COMMUNITY)
Admission: EM | Admit: 2023-12-14 | Discharge: 2023-12-15 | Disposition: A | Discharge status: Home / Self Care | Attending: Emergency Medicine | Admitting: Emergency Medicine

## 2023-12-14 ENCOUNTER — Emergency Department (HOSPITAL_COMMUNITY)

## 2023-12-14 DIAGNOSIS — Z7982 Long term (current) use of aspirin: Secondary | ICD-10-CM | POA: Diagnosis not present

## 2023-12-14 DIAGNOSIS — Z7984 Long term (current) use of oral hypoglycemic drugs: Secondary | ICD-10-CM | POA: Insufficient documentation

## 2023-12-14 DIAGNOSIS — R519 Headache, unspecified: Secondary | ICD-10-CM | POA: Diagnosis not present

## 2023-12-14 DIAGNOSIS — E119 Type 2 diabetes mellitus without complications: Secondary | ICD-10-CM | POA: Diagnosis not present

## 2023-12-14 DIAGNOSIS — R200 Anesthesia of skin: Secondary | ICD-10-CM | POA: Diagnosis present

## 2023-12-14 DIAGNOSIS — Z79899 Other long term (current) drug therapy: Secondary | ICD-10-CM | POA: Insufficient documentation

## 2023-12-14 DIAGNOSIS — I10 Essential (primary) hypertension: Secondary | ICD-10-CM | POA: Insufficient documentation

## 2023-12-14 DIAGNOSIS — Z7902 Long term (current) use of antithrombotics/antiplatelets: Secondary | ICD-10-CM | POA: Diagnosis not present

## 2023-12-14 DIAGNOSIS — Z8673 Personal history of transient ischemic attack (TIA), and cerebral infarction without residual deficits: Secondary | ICD-10-CM | POA: Diagnosis not present

## 2023-12-14 HISTORY — DX: Cerebral infarction, unspecified: I63.9

## 2023-12-14 LAB — COMPREHENSIVE METABOLIC PANEL WITH GFR
ALT: 20 U/L (ref 0–44)
AST: 21 U/L (ref 15–41)
Albumin: 3.8 g/dL (ref 3.5–5.0)
Alkaline Phosphatase: 77 U/L (ref 38–126)
Anion gap: 11 (ref 5–15)
BUN: 17 mg/dL (ref 8–23)
CO2: 27 mmol/L (ref 22–32)
Calcium: 9.3 mg/dL (ref 8.9–10.3)
Chloride: 98 mmol/L (ref 98–111)
Creatinine, Ser: 1.69 mg/dL — ABNORMAL HIGH (ref 0.61–1.24)
GFR, Estimated: 45 mL/min — ABNORMAL LOW (ref >60)
Glucose, Bld: 255 mg/dL — ABNORMAL HIGH (ref 70–99)
Potassium: 4.1 mmol/L (ref 3.5–5.1)
Sodium: 136 mmol/L (ref 135–145)
Total Bilirubin: 0.7 mg/dL (ref 0.0–1.2)
Total Protein: 7.1 g/dL (ref 6.5–8.1)

## 2023-12-14 LAB — CBC WITH DIFFERENTIAL/PLATELET
Abs Immature Granulocytes: 0.01 10*3/uL (ref 0.00–0.07)
Basophils Absolute: 0.1 10*3/uL (ref 0.0–0.1)
Basophils Relative: 1 %
Eosinophils Absolute: 0.2 10*3/uL (ref 0.0–0.5)
Eosinophils Relative: 3 %
HCT: 42.9 % (ref 39.0–52.0)
Hemoglobin: 14.4 g/dL (ref 13.0–17.0)
Immature Granulocytes: 0 %
Lymphocytes Relative: 22 %
Lymphs Abs: 1.5 10*3/uL (ref 0.7–4.0)
MCH: 29.4 pg (ref 26.0–34.0)
MCHC: 33.6 g/dL (ref 30.0–36.0)
MCV: 87.6 fL (ref 80.0–100.0)
Monocytes Absolute: 0.5 10*3/uL (ref 0.1–1.0)
Monocytes Relative: 8 %
Neutro Abs: 4.4 10*3/uL (ref 1.7–7.7)
Neutrophils Relative %: 66 %
Platelets: 233 10*3/uL (ref 150–400)
RBC: 4.9 MIL/uL (ref 4.22–5.81)
RDW: 13.7 % (ref 11.5–15.5)
WBC: 6.6 10*3/uL (ref 4.0–10.5)
nRBC: 0 % (ref 0.0–0.2)

## 2023-12-14 LAB — PROTIME-INR
INR: 1 (ref 0.8–1.2)
Prothrombin Time: 13.7 s (ref 11.4–15.2)

## 2023-12-14 LAB — I-STAT CHEM 8, ED
BUN: 19 mg/dL (ref 8–23)
Calcium, Ion: 1.14 mmol/L — ABNORMAL LOW (ref 1.15–1.40)
Chloride: 97 mmol/L — ABNORMAL LOW (ref 98–111)
Creatinine, Ser: 1.8 mg/dL — ABNORMAL HIGH (ref 0.61–1.24)
Glucose, Bld: 258 mg/dL — ABNORMAL HIGH (ref 70–99)
HCT: 45 % (ref 39.0–52.0)
Hemoglobin: 15.3 g/dL (ref 13.0–17.0)
Potassium: 4.2 mmol/L (ref 3.5–5.1)
Sodium: 136 mmol/L (ref 135–145)
TCO2: 27 mmol/L (ref 22–32)

## 2023-12-14 LAB — APTT: aPTT: 26 s (ref 24–36)

## 2023-12-14 MED ORDER — SODIUM CHLORIDE 0.9 % IV SOLN
12.5000 mg | Freq: Once | INTRAVENOUS | Status: AC
  Administered 2023-12-14: 12.5 mg via INTRAVENOUS
  Filled 2023-12-14: qty 0.5

## 2023-12-14 NOTE — ED Triage Notes (Addendum)
 Pt had  11 days ago for stroke with left sided deficits. Yesterday around 6pm noticed right facial and arm  numbness. Hiccups since discharge several days ago

## 2023-12-14 NOTE — ED Notes (Signed)
 Patient transported to CT

## 2023-12-14 NOTE — ED Provider Notes (Signed)
 [place] EMERGENCY DEPARTMENT AT [facility] Provider Note   CSN: [id number] Arrival date & time: 12/14/23  2137     History  Chief Complaint  Patient presents with   Numbness    Jason Meadows is a 63 y.o. male.  The history is provided by the patient and medical records.   63 year old male with history of hypertension, diabetes, hyperlipidemia, recent stroke 12/07/2023, currently on aspirin  and Plavix , presenting to the ED with new right sided numbness.  States he noticed this sometime yesterday (24+ hours ago) between 12pm and 6pm.  States he noticed some weird sensations, worse once he got into a hot shower and can tell that the water was very hot on his left arm but could only mildly since this with his right arm.  He did not have any of these sensations during prior stroke.  He denies any weakness on his right side.  He is been having a little bit of a headache on the left occipital area, thought maybe it was just his sensation coming back from the stroke.  He has been compliant with his medication.  He also continues to have ongoing hiccups.  This has been ongoing since last hospitalization.  He was started on oral Thorazine  but has not had any significant relief with this.  Home Medications Prior to Admission medications   Medication Sig Start Date End Date Taking? Authorizing Provider  amLODipine  (NORVASC ) 5 MG tablet Take 1 tablet (5 mg total) by mouth daily. 12/04/23   Rai, Hurman Maiden, MD  aspirin  81 MG chewable tablet Chew 1 tablet (81 mg total) by mouth daily. 12/05/23   Rai, Hurman Maiden, MD  atorvastatin  (LIPITOR ) 80 MG tablet Take 1 tablet (80 mg total) by mouth daily. 12/04/23   Rai, Hurman Maiden, MD  Baclofen  5 MG TABS Take 1 tablet (5 mg total) by mouth 3 (three) times daily as needed (Hiccups). 12/08/23   Hongalgi, Anand D, MD  clopidogrel  (PLAVIX ) 75 MG tablet Take 1 tablet (75 mg total) by mouth daily. Take after finishing clopidogrel  75mg  that was picked up 4  days ago on 12/04/23. 12/08/23 12/29/23  Hongalgi, Anand D, MD  metFORMIN  (GLUCOPHAGE ) 1000 MG tablet Take 1 tablet (1,000 mg total) by mouth 2 (two) times daily with a meal. 12/04/23   Rai, Ripudeep K, MD  nicotine  (NICODERM CQ  - DOSED IN MG/24 HOURS) 21 mg/24hr patch Place 1 patch (21 mg total) onto the skin daily. Patient not taking: Reported on 12/07/2023 12/05/23   Rai, Hurman Maiden, MD  pantoprazole  (PROTONIX ) 40 MG tablet Take 1 tablet (40 mg total) by mouth daily. 12/08/23   Hongalgi, Anand D, MD      Allergies    Patient has no known allergies.    Review of Systems   Review of Systems  Neurological:  Positive for numbness.  All other systems reviewed and are negative.   Physical Exam Updated Vital Signs BP 131/74   Pulse 74   Temp 98 F (36.7 C) (Oral)   Resp 18   SpO2 100%   Physical Exam Vitals and nursing note reviewed.  Constitutional:      Appearance: He is well-developed.  HENT:     Head: Normocephalic and atraumatic.  Eyes:     Conjunctiva/sclera: Conjunctivae normal.     Pupils: Pupils are equal, round, and reactive to light.  Cardiovascular:     Rate and Rhythm: Normal rate and regular rhythm.     Heart sounds: Normal  heart sounds.  Pulmonary:     Effort: Pulmonary effort is normal. No respiratory distress.     Breath sounds: Normal breath sounds. No rhonchi.  Abdominal:     General: Bowel sounds are normal.     Palpations: Abdomen is soft.  Musculoskeletal:        General: Normal range of motion.     Cervical back: Normal range of motion.  Skin:    General: Skin is warm and dry.  Neurological:     Mental Status: He is alert and oriented to person, place, and time.     Comments: AAOx3, answering questions and following commands appropriately; equal strength UE and LE bilaterally; CN grossly intact; moves all extremities appropriately without ataxia; decreased sensation of right arm compared with left, seemingly normal sensation of both legs, no facial  asymmetry, left upper eyelid is a bit droopy at times but fully able to open/close when prompted, speech is clear and goal oriented     ED Results / Procedures / Treatments   Labs (all labs ordered are listed, but only abnormal results are displayed) Labs Reviewed  COMPREHENSIVE METABOLIC PANEL WITH GFR - Abnormal; Notable for the following components:      Result Value   Glucose, Bld 255 (*)    Creatinine, Ser 1.69 (*)    GFR, Estimated 45 (*)    All other components within normal limits  I-STAT CHEM 8, ED - Abnormal; Notable for the following components:   Chloride 97 (*)    Creatinine, Ser 1.80 (*)    Glucose, Bld 258 (*)    Calcium , Ion 1.14 (*)    All other components within normal limits  CBC WITH DIFFERENTIAL/PLATELET  PROTIME-INR  APTT  URINALYSIS, W/ REFLEX TO CULTURE (INFECTION SUSPECTED)    EKG None  Radiology MR BRAIN WO CONTRAST Result Date: 12/15/2023 CLINICAL DATA:  Neuro deficit, acute, stroke suspected EXAM: MRI HEAD WITHOUT CONTRAST TECHNIQUE: Multiplanar, multiecho pulse sequences of the brain and surrounding structures were obtained without intravenous contrast. COMPARISON:  MRI April 29, 25. FINDINGS: Brain: Expected evolution of a small acute infarct in the left lateral medulla (series 5, image 59). No evidence of new/interval acute infarct, acute hemorrhage, midline shift or hydrocephalus. Similar appearance of hemosiderin staining in the right pons, compatible with cavernous malformation. Additional punctate microhemorrhages scattered elsewhere also similar. Advanced patchy white matter T2/FLAIR hyperintensities are compatible with chronic microvascular ischemic change. Remote right basal ganglia lacunar infarct. Vascular: Major arterial flow voids are maintained at the skull base. Skull and upper cervical spine: Normal marrow signal. Sinuses/Orbits: Clear sinuses.  No acute orbital findings. Other: No mastoid effusions. IMPRESSION: 1. Expected evolution of a  small acute infarct in the left lateral medulla. 2. Advanced chronic microvascular ischemic disease. 3. Similar right pontine cavernous malformation. Electronically Signed   By: Stevenson Elbe M.D.   On: 12/15/2023 02:41   CT HEAD WO CONTRAST ( ) Result Date: 12/14/2023 CLINICAL DATA:  Neuro deficit, acute, stroke suspected EXAM: CT HEAD WITHOUT CONTRAST TECHNIQUE: Contiguous axial images were obtained from the base of the skull through the vertex without intravenous contrast. RADIATION DOSE REDUCTION: This exam was performed according to the departmental dose-optimization program which includes automated exposure control, adjustment of the mA and/or kV according to patient size and/or use of iterative reconstruction technique. COMPARISON:  MRI head April 29, 25. FINDINGS: Brain: Similar mild hyperdensity in the right pons, compatible cavernous malformation seen on recent MRI. Known small infarct in the medulla is not  well visualized by CT. Small remote perforator infarct in the right basal ganglia. Patchy white matter hypodensities, compatible with chronic microvascular ischemic disease. No evidence of acute large vascular territory infarct, acute hemorrhage, new mass lesion or midline shift. Vascular: No hyperdense vessel.  Calcific atherosclerosis. Skull: No acute fracture. Sinuses/Orbits: Clear sinuses.  No acute orbital findings. Other: No mastoid effusions. IMPRESSION: 1. Known small infarct in the medulla is not well visualized by CT. No evidence of new/interval acute large vascular territory infarct. 2. Known cavernous malformation in the pons, better seen on recent MRI. 3. Chronic microvascular ischemic disease. Electronically Signed   By: Stevenson Elbe M.D.   On: 12/14/2023 23:18    Procedures Procedures    Medications Ordered in ED Medications  chlorproMAZINE  (THORAZINE ) 12.5 mg in sodium chloride  0.9 % 25 mL IVPB (0 mg Intravenous Stopped 12/15/23 0004)  chlorproMAZINE  (THORAZINE )  tablet 25 mg (25 mg Oral Given 12/15/23 0049)  baclofen  (LIORESAL ) tablet 5 mg (5 mg Oral Given 12/15/23 0049)    ED Course/ Medical Decision Making/ A&P                                 Medical Decision Making Amount and/or Complexity of Data Reviewed Labs: ordered. Radiology: ordered and independent interpretation performed. ECG/medicine tests: ordered and independent interpretation performed.  Risk Prescription drug management.   63 y.o. M here with right arm numbness that began yesterday between 12pm-6pm.  Had stroke about 11 days ago, some left sided deficits from this.    He is awake, alert, oriented x3.  He does not have any gross motor deficits at this time.  He does have some decreased sensation of the right arm compared with left.  Also has noted ongoing hiccups.  This has been ongoing since prior hospitalization.  No relief with oral Thorazine  earlier today.  Will try IV Thorazine .  Plan for CT head, repeat labs.  CT head negative.  Labs grossly reassuring.  Still having fairly significant hiccups.  Discussed with pharmacist, recommended additional IV Thorazine  and baclofen .  These were given which seem to lessen his symptoms.  Will try to get MRI.  MRI with evolution of prior stroke but no acute findings otherwise.  3:01 AM Spoke with neurology, Dr. Alecia Ames-- we reviewed MRI from today.  Without new changes, no utility for repeat admission.  Continue ASA and plavix , OP follow-up.  Patient reassessed--still having some hiccups but less so than previous.  He remains HD stable.  Results and recommendation from neurology discussed with patient and wife, they voiced understanding.  He actually has follow-up with his neurologist 2 days from now.  Will have him continue Thorazine  for hiccups.  Further recommendations per outpatient neurology.  Given strict return precautions for any new/acute changes.  Final Clinical Impression(s) / ED Diagnoses Final diagnoses:  Right arm  numbness    Rx / DC Orders ED Discharge Orders     None         Coretha Dew, PA-C 12/15/23 0454    Tegeler, Marine Sia, MD 12/21/23 [phone]

## 2023-12-15 ENCOUNTER — Emergency Department (HOSPITAL_COMMUNITY)

## 2023-12-15 MED ORDER — BACLOFEN 10 MG PO TABS
5.0000 mg | ORAL_TABLET | Freq: Once | ORAL | Status: AC
  Administered 2023-12-15: 5 mg via ORAL
  Filled 2023-12-15: qty 1

## 2023-12-15 MED ORDER — CHLORPROMAZINE HCL 25 MG PO TABS
25.0000 mg | ORAL_TABLET | Freq: Once | ORAL | Status: AC
  Administered 2023-12-15: 25 mg via ORAL
  Filled 2023-12-15: qty 1

## 2023-12-15 NOTE — Discharge Instructions (Signed)
 Continue your medications as prescribed.   Follow-up with Dr. Tresia Fruit on Thursday as scheduled-- she will be able to review MRI and notes from this visit. Return to the ED for any new/acute changes-- new weakness, severe headache, slurred speech, visual changes, etc.

## 2023-12-17 ENCOUNTER — Ambulatory Visit (INDEPENDENT_AMBULATORY_CARE_PROVIDER_SITE_OTHER): Payer: Self-pay | Admitting: Diagnostic Neuroimaging

## 2023-12-17 ENCOUNTER — Encounter: Payer: Self-pay | Admitting: Diagnostic Neuroimaging

## 2023-12-17 VITALS — BP 106/68 | HR 65 | Ht 71.0 in | Wt 232.0 lb

## 2023-12-17 DIAGNOSIS — G463 Brain stem stroke syndrome: Secondary | ICD-10-CM

## 2023-12-17 DIAGNOSIS — I639 Cerebral infarction, unspecified: Secondary | ICD-10-CM

## 2023-12-17 MED ORDER — CHLORPROMAZINE HCL 25 MG PO TABS: 25.0000 mg | ORAL_TABLET | Freq: Three times a day (TID) | ORAL | 4 refills | Status: AC

## 2023-12-17 MED ORDER — BACLOFEN 5 MG PO TABS: 5.0000 mg | ORAL_TABLET | Freq: Three times a day (TID) | ORAL | 4 refills | Status: AC | PRN

## 2023-12-17 NOTE — Patient Instructions (Addendum)
   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

## 2023-12-17 NOTE — Progress Notes (Unsigned)
 [facility]  PATIENT: Jason Meadows DOB: 11-28-1960  REFERRING CLINICIAN: Consuelo Denmark, MD HISTORY FROM: patient and wife REASON FOR VISIT: new consult   HISTORICAL  CHIEF COMPLAINT:  Chief Complaint  Patient presents with   Hospitalization Follow-up    Rm7, wife present, hospital followup: pt has been back to hospital since the stroke due to the constant hiccupping. Pt wife stated that the hiccuping even happens when pt is sleeping. Pt has unsteady geit and uses walker for mobility support. 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 Pt stated that he feels more fatigued now     HISTORY OF PRESENT ILLNESS:   63 year old male here for evaluation of stroke.  History of hypertension, hyperlipidemia, diabetes, smoking, presents to the hospital for left face and right body numbness, gait and balance difficulty.  Also developed intractable hiccups.  Was been to the hospital was found to have a left lateral medullary ischemic infarction.  Stroke workup was completed.  Patient is continue to have severe intractable hiccups.  Went to the emergency room for evaluation.  Was started on chlorpromazine  symptoms have improved today slightly.   REVIEW OF SYSTEMS: Full 14 system review of systems performed and negative with exception of: as per HPI.  ALLERGIES: No Known Allergies  HOME MEDICATIONS: Outpatient Medications Prior to Visit  Medication Sig Dispense Refill   amLODipine  (NORVASC ) 5 MG tablet Take 1 tablet (5 mg total) by mouth daily. 30 tablet 3   aspirin  81 MG chewable tablet Chew 1 tablet (81 mg total) by mouth daily. 30 tablet 3   atorvastatin  (LIPITOR ) 80 MG tablet Take 1 tablet (80 mg total) by mouth daily. 30 tablet 3   clopidogrel  (PLAVIX ) 75 MG tablet Take 1 tablet (75 mg total) by mouth daily. Take after finishing clopidogrel  75mg  that was picked up 4 days ago on 12/04/23. 21 tablet 0    metFORMIN  (GLUCOPHAGE ) 1000 MG tablet Take 1 tablet (1,000 mg total) by mouth 2 (two) times daily with a meal. 60 tablet 3   nicotine  (NICODERM CQ  - DOSED IN MG/24 HOURS) 21 mg/24hr patch Place 1 patch (21 mg total) onto the skin daily. 28 patch 0   pantoprazole  (PROTONIX ) 40 MG tablet Take 1 tablet (40 mg total) by mouth daily. 30 tablet 0   Baclofen  5 MG TABS Take 1 tablet (5 mg total) by mouth 3 (three) times daily as needed (Hiccups). 15 each 0   chlorproMAZINE  (THORAZINE ) 25 MG tablet Take 25 mg by mouth 3 (three) times daily.     No facility-administered medications prior to visit.    PAST MEDICAL HISTORY: Past Medical History:  Diagnosis Date   Diabetes mellitus without complication (HCC)    High cholesterol    Hypertension    Stroke (HCC)     PAST SURGICAL HISTORY: Past Surgical History:  Procedure Laterality Date   BACK SURGERY     EXTERNAL EAR SURGERY     KNEE SURGERY      FAMILY HISTORY: History reviewed. No pertinent family history.  SOCIAL HISTORY: Social History   Socioeconomic History   Marital status: Single    Spouse name: Not on file   Number of children: Not on file   Years of education: Not on file   Highest education level: Not on file  Occupational History   Not on file  Tobacco Use   Smoking status: Every Day    Types: Cigarettes  Smokeless tobacco: Never  Vaping Use   Vaping status: Never Used  Substance and Sexual Activity   Alcohol use: No   Drug use: No   Sexual activity: Not on file  Other Topics Concern   Not on file  Social History Narrative   Not on file   Social Drivers of Health   Financial Resource Strain: Not on file  Food Insecurity: Low Risk  (12/15/2023)   Received from [facility]   Hunger Vital Sign    Worried About Running Out of Food in the Last Year: Never true    Ran Out of Food in the Last Year: Never true  Transportation Needs: No Transportation Needs (12/15/2023)   Received from [employer]    In the past 12 months, has lack of reliable transportation kept you from medical appointments, meetings, work or from getting things needed for daily living? : No  Physical Activity: Not on file  Stress: Not on file  Social Connections: Not on file  Intimate Partner Violence: Not At Risk (12/07/2023)   Humiliation, Afraid, Rape, and Kick questionnaire    Fear of Current or Ex-Partner: No    Emotionally Abused: No    Physically Abused: No    Sexually Abused: No     PHYSICAL EXAM  GENERAL EXAM/CONSTITUTIONAL: Vitals:  Vitals:   12/17/23 1038  BP: 106/68  Pulse: 65  Weight: 232 lb (105.2 kg)  Height: 5\' 11"  (1.803 m)   Body mass index is 32.36 kg/m. Wt Readings from Last 3 Encounters:  12/17/23 232 lb (105.2 kg)  12/07/23 225 lb 8.5 oz (102.3 kg)  12/02/23 243 lb (110.2 kg)   Patient is in no distress; well developed, nourished and groomed; neck is supple  CARDIOVASCULAR: Examination of carotid arteries is normal; no carotid bruits Regular rate and rhythm, no murmurs Examination of peripheral vascular system by observation and palpation is normal  EYES: Ophthalmoscopic exam of optic discs and posterior segments is normal; no papilledema or hemorrhages No results found.  MUSCULOSKELETAL: Gait, strength, tone, movements noted in Neurologic exam below  NEUROLOGIC: MENTAL STATUS:      No data to display         awake, alert, oriented to person, place and time recent and remote memory intact normal attention and concentration language fluent, comprehension intact, naming intact fund of knowledge appropriate  CRANIAL NERVE:  2nd - no papilledema on fundoscopic exam 2nd, 3rd, 4th, 6th - pupils equal and reactive to light, visual fields full to confrontation, extraocular muscles intact, no nystagmus 5th - facial sensation --> LEFT FACE NUMBNESS 7th - facial strength symmetric 8th - hearing intact 9th - palate elevates symmetrically, uvula  midline 11th - shoulder shrug symmetric 12th - tongue protrusion --> DEVIATES SLIGHTLY TO LEFT  MOTOR:  normal bulk and tone, full strength in the BUE, BLE  SENSORY:  RIGHT ARM AND LEG NUMBNESS DECR VIB IN LEFT ARM  COORDINATION:  finger-nose-finger, fine finger movements --> MILD LEFT ARM DYSMETRIA  REFLEXES:  deep tendon reflexes trace and symmetric  GAIT/STATION:  Using walker     DIAGNOSTIC DATA (LABS, IMAGING, TESTING) - I reviewed patient records, labs, notes, testing and imaging myself where available.  Lab Results  Component Value Date   WBC 6.6 12/14/2023   HGB 15.3 12/14/2023   HCT 45.0 12/14/2023   MCV 87.6 12/14/2023   PLT 233 12/14/2023      Component Value Date/Time   NA 136 12/14/2023 2233  K 4.2 12/14/2023 2233   CL 97 (L) 12/14/2023 2233   CO2 27 12/14/2023 2226   GLUCOSE 258 (H) 12/14/2023 2233   BUN 19 12/14/2023 2233   CREATININE 1.80 (H) 12/14/2023 2233   CALCIUM  9.3 12/14/2023 2226   PROT 7.1 12/14/2023 2226   ALBUMIN 3.8 12/14/2023 2226   AST 21 12/14/2023 2226   ALT 20 12/14/2023 2226   ALKPHOS 77 12/14/2023 2226   BILITOT 0.7 12/14/2023 2226   GFRNONAA 45 (L) 12/14/2023 2226   GFRAA >60 12/15/2019 1810   Lab Results  Component Value Date   CHOL 209 (H) 12/03/2023   HDL 18 (L) 12/03/2023   LDLCALC 166 (H) 12/03/2023   TRIG 127 12/03/2023   CHOLHDL 11.6 12/03/2023   Lab Results  Component Value Date   HGBA1C 8.2 (H) 12/04/2023   No results found for: "VITAMINB12" No results found for: "TSH"    ASSESSMENT AND PLAN  63 y.o. year old male here with:   Dx:  1. Wallenberg syndrome   2. 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  CT, CTA head and neck, MRI all negative.  EF 60 to 65%, LDL 166, A1c 8.2, UDS negative.  Discharged on DAPT and Lipitor  80.   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     Orders Placed This Encounter  Procedures   Ambulatory referral to Sleep Studies    Meds ordered this encounter  Medications   chlorproMAZINE  (THORAZINE ) 25 MG tablet    Sig: Take 1-2 tablets (25-50 mg total) by mouth 3 (three) times daily.    Dispense:  180 tablet    Refill:  4   Baclofen  5 MG TABS    Sig: Take 1-2 tablets (5-10 mg total) by mouth 3 (three) times daily as needed (Hiccups).    Dispense:  180 tablet    Refill:  4    Return for pending if symptoms worsen or fail to improve.    Omega Bible, MD 12/17/2023, 11:39 AM Certified in Neurology, Neurophysiology and Neuroimaging  [facility] [address] [place], [place] [address] [phone]

## 2023-12-20 ENCOUNTER — Encounter: Payer: Self-pay | Admitting: Diagnostic Neuroimaging

## 2024-01-10 ENCOUNTER — Encounter (HOSPITAL_COMMUNITY): Payer: Self-pay

## 2024-01-10 ENCOUNTER — Emergency Department (HOSPITAL_COMMUNITY)
Admission: EM | Admit: 2024-01-10 | Discharge: 2024-01-11 | Disposition: A | Discharge status: Home / Self Care | Attending: Emergency Medicine | Admitting: Emergency Medicine

## 2024-01-10 ENCOUNTER — Emergency Department (HOSPITAL_COMMUNITY)

## 2024-01-10 DIAGNOSIS — Z7984 Long term (current) use of oral hypoglycemic drugs: Secondary | ICD-10-CM | POA: Diagnosis not present

## 2024-01-10 DIAGNOSIS — Z7982 Long term (current) use of aspirin: Secondary | ICD-10-CM | POA: Diagnosis not present

## 2024-01-10 DIAGNOSIS — R202 Paresthesia of skin: Secondary | ICD-10-CM | POA: Diagnosis present

## 2024-01-10 DIAGNOSIS — Z7901 Long term (current) use of anticoagulants: Secondary | ICD-10-CM | POA: Insufficient documentation

## 2024-01-10 LAB — CBC WITH DIFFERENTIAL/PLATELET
Abs Immature Granulocytes: 0.02 10*3/uL (ref 0.00–0.07)
Basophils Absolute: 0.1 10*3/uL (ref 0.0–0.1)
Basophils Relative: 1 %
Eosinophils Absolute: 0.2 10*3/uL (ref 0.0–0.5)
Eosinophils Relative: 4 %
HCT: 43.8 % (ref 39.0–52.0)
Hemoglobin: 14.7 g/dL (ref 13.0–17.0)
Immature Granulocytes: 0 %
Lymphocytes Relative: 32 %
Lymphs Abs: 2.1 10*3/uL (ref 0.7–4.0)
MCH: 29 pg (ref 26.0–34.0)
MCHC: 33.6 g/dL (ref 30.0–36.0)
MCV: 86.4 fL (ref 80.0–100.0)
Monocytes Absolute: 0.5 10*3/uL (ref 0.1–1.0)
Monocytes Relative: 8 %
Neutro Abs: 3.6 10*3/uL (ref 1.7–7.7)
Neutrophils Relative %: 55 %
Platelets: 211 10*3/uL (ref 150–400)
RBC: 5.07 MIL/uL (ref 4.22–5.81)
RDW: 13.4 % (ref 11.5–15.5)
WBC: 6.6 10*3/uL (ref 4.0–10.5)
nRBC: 0 % (ref 0.0–0.2)

## 2024-01-10 LAB — BASIC METABOLIC PANEL WITH GFR
Anion gap: 11 (ref 5–15)
BUN: 11 mg/dL (ref 8–23)
CO2: 25 mmol/L (ref 22–32)
Calcium: 9.6 mg/dL (ref 8.9–10.3)
Chloride: 97 mmol/L — ABNORMAL LOW (ref 98–111)
Creatinine, Ser: 1.1 mg/dL (ref 0.61–1.24)
GFR, Estimated: >60 mL/min (ref >60)
Glucose, Bld: 248 mg/dL — ABNORMAL HIGH (ref 70–99)
Potassium: 4 mmol/L (ref 3.5–5.1)
Sodium: 133 mmol/L — ABNORMAL LOW (ref 135–145)

## 2024-01-10 NOTE — ED Provider Triage Note (Signed)
 Emergency Medicine Provider Triage Evaluation Note  ZEPH RIEBEL , a 63 y.o. male  was evaluated in triage.  Pt complains of left sided numbness around the mouth. No numbness weakness in arms or legs. Therapy thought tongue was droopy.  No change in vision or speech.  No headache.    LNW 8AM, 9AM noticed.   Review of Systems  Positive: See above Negative: See above  Physical Exam  BP 129/84   Pulse (!) 57   Temp 98.2 F (36.8 C)   Resp 18   SpO2 98%  Gen:   Awake, no distress   Resp:  Normal effort  MSK:   Moves extremities without difficulty  Other:  Neruo--numbness right face/arm leg at baseline, new numbness left face, no weakness. No facial droop, question tongue slight deviation to right  Medical Decision Making  Medically screening exam initiated at 8:54 PM.  Appropriate orders placed.  AARUSH STUKEY was informed that the remainder of the evaluation will be completed by another provider, this initial triage assessment does not replace that evaluation, and the importance of remaining in the ED until their evaluation is complete.  Ordered MRI, concern for infarct. Out of tnkase window, LVO negative, no headache.    Scarlette Currier, MD 01/10/24 2104

## 2024-01-10 NOTE — ED Triage Notes (Signed)
 At 9am onset L side facial numbness, last known normal 8am Previous stroke on April 5th and had R side numbness/weakness  Walks with cane  BCS 341, takes metformin  142/80 60 96% 18g LAC NS given

## 2024-01-11 NOTE — ED Provider Notes (Addendum)
 [name] EMERGENCY DEPARTMENT AT [facility] Provider Note   CSN: [id number] Arrival date & time: 01/10/24  1828     History  Chief Complaint  Patient presents with   Numbness    Jason Meadows is a 63 y.o. male.  Patient here with numbness around his lips bilateral lower face but he recently had a stroke where he has some residual numbness to the right side of his face and weakness to the right side of his body.  Started to feel numb to the left lower face and around the lips.  Still has some of it now on exam even after patient has been in the waiting room overnight.  He has no chest pain weakness numbness tingling otherwise.  He has been doing well with his rehab.  He has been ambulating with a walker.  He is on aspirin  he was on Plavix  for a couple weeks.  He is on blood pressure meds and cholesterol meds.  Denies any chest pain shortness of breath.  The history is provided by the patient.       Home Medications Prior to Admission medications   Medication Sig Start Date End Date Taking? Authorizing Provider  amLODipine  (NORVASC ) 5 MG tablet Take 1 tablet (5 mg total) by mouth daily. 12/04/23   Rai, Hurman Maiden, MD  aspirin  81 MG chewable tablet Chew 1 tablet (81 mg total) by mouth daily. 12/05/23   Rai, Hurman Maiden, MD  atorvastatin  (LIPITOR ) 80 MG tablet Take 1 tablet (80 mg total) by mouth daily. 12/04/23   Rai, Hurman Maiden, MD  Baclofen  5 MG TABS Take 1-2 tablets (5-10 mg total) by mouth 3 (three) times daily as needed (Hiccups). 12/17/23   Penumalli, Vikram R, MD  chlorproMAZINE  (THORAZINE ) 25 MG tablet Take 1-2 tablets (25-50 mg total) by mouth 3 (three) times daily. 12/17/23   Penumalli, Vikram R, MD  metFORMIN  (GLUCOPHAGE ) 1000 MG tablet Take 1 tablet (1,000 mg total) by mouth 2 (two) times daily with a meal. 12/04/23   Rai, Ripudeep K, MD  nicotine  (NICODERM CQ  - DOSED IN MG/24 HOURS) 21 mg/24hr patch Place 1 patch (21 mg total) onto the skin daily. 12/05/23   Rai,  Hurman Maiden, MD  pantoprazole  (PROTONIX ) 40 MG tablet Take 1 tablet (40 mg total) by mouth daily. 12/08/23   Hongalgi, Anand D, MD      Allergies    Patient has no known allergies.    Review of Systems   Review of Systems  Physical Exam Updated Vital Signs BP (!) 154/91 (BP Location: Right Arm)   Pulse 71   Temp 97.6 F (36.4 C) (Oral)   Resp 15   SpO2 100%  Physical Exam Vitals and nursing note reviewed.  Constitutional:      General: He is not in acute distress.    Appearance: He is well-developed. He is not ill-appearing.  HENT:     Head: Normocephalic and atraumatic.     Nose: Nose normal.     Mouth/Throat:     Mouth: Mucous membranes are moist.  Eyes:     Extraocular Movements: Extraocular movements intact.     Conjunctiva/sclera: Conjunctivae normal.     Pupils: Pupils are equal, round, and reactive to light.  Cardiovascular:     Rate and Rhythm: Normal rate and regular rhythm.     Pulses: Normal pulses.     Heart sounds: Normal heart sounds. No murmur heard. Pulmonary:     Effort: Pulmonary effort  is normal. No respiratory distress.     Breath sounds: Normal breath sounds.  Abdominal:     Palpations: Abdomen is soft.     Tenderness: There is no abdominal tenderness.  Musculoskeletal:        General: No swelling.     Cervical back: Normal range of motion and neck supple.  Skin:    General: Skin is warm and dry.     Capillary Refill: Capillary refill takes less than 2 seconds.  Neurological:     Mental Status: He is alert.     Comments: States some numbness to the right side of his face and around the left side of his lips as well but otherwise 5+ out of 5 strength throughout the left side, some trace weakness in the right lower leg and right upper extremity, normal speech normal visual fields no drift normal coordination  Psychiatric:        Mood and Affect: Mood normal.     ED Results / Procedures / Treatments   Labs (all labs ordered are listed, but  only abnormal results are displayed) Labs Reviewed  BASIC METABOLIC PANEL WITH GFR - Abnormal; Notable for the following components:      Result Value   Sodium 133 (*)    Chloride 97 (*)    Glucose, Bld 248 (*)    All other components within normal limits  CBC WITH DIFFERENTIAL/PLATELET    EKG EKG Interpretation Date/Time:  Monday January 10 2024 21:17:16 EDT Ventricular Rate:  55 PR Interval:  132 QRS Duration:  92 QT Interval:  394 QTC Calculation: 376 R Axis:   60  Text Interpretation: Sinus bradycardia When compared with ECG of 14-Dec-2023 21:42, PREVIOUS ECG IS PRESENT Confirmed by Lowery Rue [phone]) on 01/11/2024 7:58:46 AM  Radiology MR BRAIN WO CONTRAST Result Date: 01/11/2024 CLINICAL DATA:  Neuro deficit, acute, stroke suspected EXAM: MRI HEAD WITHOUT CONTRAST TECHNIQUE: Multiplanar, multiecho pulse sequences of the brain and surrounding structures were obtained without intravenous contrast. COMPARISON:  MRI Dec 15, 2023. FINDINGS: Brain: Continued decrease in conspicuity of left lateral medulla infarct, now barely visible. No evidence of acute/interval infarct, acute hemorrhage, mass lesion, midline shift or hydrocephalus. Similar appearance of hemosiderin staining in the right pons, compatible with cavernous malformation. Additional scattered microhemorrhages are also similar. Patchy T2 hyperintensity in the white matter, compatible with chronic microvascular disease. Vascular: Major arterial flow voids are maintained skull base. Skull and upper cervical spine: Normal marrow signal. Sinuses/Orbits: Clear sinuses.  No acute orbital findings. Other: No mastoid effusions. IMPRESSION: 1. Continued decrease in conspicuity of left lateral medulla infarct, now barely visible. 2. No evidence of acute/interval infarct. 3. 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 Electronically Signed   By: Stevenson Elbe M.D.   On: 01/11/2024 00:16     Procedures Procedures    Medications Ordered in ED Medications - No data to display  ED Course/ Medical Decision Making/ A&P                                 Medical Decision Making Amount and/or Complexity of Data Reviewed Labs: ordered.   Jason Meadows is here with some numbness and tingling around the left side of his mouth and right side of his face.  History of stroke here couple weeks ago.  Was on Plavix  for a few weeks now on aspirin .  Has been compliant with all  of his medications.  He has been doing well at rehab.  He had mostly right sided symptoms from his stroke with right-sided weakness and numbness.  Weakness has improved he still has some trace weakness in his leg and arm and uses a cane to ambulate.  Still has some numbness in his face but may be some numbness around the left side of the lips as well now.  He noticed this yesterday morning at 8 AM 24 hours ago.  Still has a little bit of that feeling now but not as bad he states.  He is had a CBC BMP and MRI while in the waiting room.  EKG per my review interpretation is unremarkable.  Overall there is no evidence of acute or interval infarct.  He has still little bit of left lateral medulla infarct but now barely visible per radiology report.  But otherwise lab work for my review and interpretation is unremarkable.  Calcium  is normal.  I have no concern for infectious process.  Does not appear that there is any acute neurologic process at this time.  He is describing some paresthesias that are around his lips.  But does not appear that there is any new process at this time.  He still having some symptoms intermittently and ongoing despite normal MRI and it has been 24 hours now.  I doubt this is a TIA.  I did talk with Dr. Doretta Gant with neurology who agreed no change in his outpatient management.  Overall given reassurance.  Recommend continue to work with primary care doctor and neurology.  Discharged in good condition.  This  chart was dictated using voice recognition software.  Despite best efforts to proofread,  errors can occur which can change the documentation meaning.         Final Clinical Impression(s) / ED Diagnoses Final diagnoses:  Paresthesias    Rx / DC Orders ED Discharge Orders     None         Lowery Rue, DO 01/11/24 1610    Lowery Rue, DO 01/11/24 0900

## 2024-01-11 NOTE — Discharge Instructions (Signed)
 Continue follow-up with your primary care doctor and neurology.  Return if symptoms worsen.

## 2024-01-26 ENCOUNTER — Institutional Professional Consult (permissible substitution): Admitting: Neurology

## 2024-01-28 ENCOUNTER — Institutional Professional Consult (permissible substitution): Admitting: Neurology
